# Patient Record
Sex: Female | Born: 1945 | Race: White | Hispanic: No | Smoking: Current every day smoker
Health system: Southern US, Community
[De-identification: ages and names within clinical notes are randomized; demographics above are authoritative.]

## PROBLEM LIST (undated history)

## (undated) DIAGNOSIS — D649 Anemia, unspecified: Secondary | ICD-10-CM

## (undated) DIAGNOSIS — I6529 Occlusion and stenosis of unspecified carotid artery: Secondary | ICD-10-CM

## (undated) DIAGNOSIS — I10 Essential (primary) hypertension: Secondary | ICD-10-CM

## (undated) DIAGNOSIS — E785 Hyperlipidemia, unspecified: Secondary | ICD-10-CM

## (undated) DIAGNOSIS — J449 Chronic obstructive pulmonary disease, unspecified: Secondary | ICD-10-CM

## (undated) DIAGNOSIS — I639 Cerebral infarction, unspecified: Secondary | ICD-10-CM

## (undated) HISTORY — PX: CATARACT EXTRACTION W/ INTRAOCULAR LENS  IMPLANT, BILATERAL: SHX1307

## (undated) HISTORY — PX: CARPAL TUNNEL RELEASE: SHX101

## (undated) HISTORY — DX: Occlusion and stenosis of unspecified carotid artery: I65.29

## (undated) HISTORY — PX: BACK SURGERY: SHX140

## (undated) HISTORY — PX: ABDOMINAL HYSTERECTOMY: SHX81

## (undated) HISTORY — DX: Anemia, unspecified: D64.9

## (undated) HISTORY — PX: JOINT REPLACEMENT: SHX530

## (undated) HISTORY — DX: Cerebral infarction, unspecified: I63.9

## (undated) HISTORY — PX: SHOULDER ARTHROSCOPY: SHX128

## (undated) HISTORY — PX: TUBAL LIGATION: SHX77

---

## 1983-03-23 HISTORY — PX: HEMORRHOID SURGERY: SHX153

## 2000-05-24 ENCOUNTER — Ambulatory Visit (HOSPITAL_COMMUNITY): Admission: RE | Admit: 2000-05-24 | Discharge: 2000-05-24 | Payer: Self-pay | Admitting: Neurology

## 2000-05-24 ENCOUNTER — Encounter: Payer: Self-pay | Admitting: Neurology

## 2000-05-30 ENCOUNTER — Encounter: Admission: RE | Admit: 2000-05-30 | Discharge: 2000-08-28 | Payer: Self-pay | Admitting: Anesthesiology

## 2001-06-22 ENCOUNTER — Encounter: Admission: RE | Admit: 2001-06-22 | Discharge: 2001-06-22 | Payer: Self-pay | Admitting: Internal Medicine

## 2001-06-22 ENCOUNTER — Encounter: Payer: Self-pay | Admitting: Internal Medicine

## 2001-08-23 ENCOUNTER — Encounter: Admission: RE | Admit: 2001-08-23 | Discharge: 2001-08-23 | Payer: Self-pay | Admitting: Internal Medicine

## 2001-08-23 ENCOUNTER — Encounter: Payer: Self-pay | Admitting: Internal Medicine

## 2001-11-14 ENCOUNTER — Encounter: Admission: RE | Admit: 2001-11-14 | Discharge: 2001-11-14 | Payer: Self-pay | Admitting: Internal Medicine

## 2001-11-14 ENCOUNTER — Encounter: Payer: Self-pay | Admitting: Internal Medicine

## 2003-02-13 ENCOUNTER — Emergency Department (HOSPITAL_COMMUNITY): Admission: EM | Admit: 2003-02-13 | Discharge: 2003-02-13 | Payer: Self-pay | Admitting: Emergency Medicine

## 2003-06-20 ENCOUNTER — Encounter: Admission: RE | Admit: 2003-06-20 | Discharge: 2003-06-20 | Payer: Self-pay | Admitting: Orthopaedic Surgery

## 2003-07-18 ENCOUNTER — Encounter: Admission: RE | Admit: 2003-07-18 | Discharge: 2003-07-18 | Payer: Self-pay | Admitting: Orthopaedic Surgery

## 2003-07-25 ENCOUNTER — Observation Stay (HOSPITAL_COMMUNITY): Admission: RE | Admit: 2003-07-25 | Discharge: 2003-07-26 | Payer: Self-pay | Admitting: Orthopaedic Surgery

## 2003-11-18 ENCOUNTER — Encounter: Admission: RE | Admit: 2003-11-18 | Discharge: 2003-11-18 | Payer: Self-pay | Admitting: Orthopaedic Surgery

## 2004-02-23 ENCOUNTER — Emergency Department (HOSPITAL_COMMUNITY): Admission: EM | Admit: 2004-02-23 | Discharge: 2004-02-23 | Payer: Self-pay | Admitting: Emergency Medicine

## 2004-04-29 ENCOUNTER — Ambulatory Visit (HOSPITAL_COMMUNITY): Admission: RE | Admit: 2004-04-29 | Discharge: 2004-04-29 | Payer: Self-pay | Admitting: Orthopaedic Surgery

## 2005-07-13 ENCOUNTER — Observation Stay (HOSPITAL_COMMUNITY): Admission: EM | Admit: 2005-07-13 | Discharge: 2005-07-15 | Payer: Self-pay | Admitting: Emergency Medicine

## 2005-07-14 ENCOUNTER — Encounter: Payer: Self-pay | Admitting: Cardiology

## 2005-08-06 ENCOUNTER — Encounter: Admission: RE | Admit: 2005-08-06 | Discharge: 2005-08-06 | Payer: Self-pay | Admitting: *Deleted

## 2005-09-08 ENCOUNTER — Ambulatory Visit (HOSPITAL_COMMUNITY): Admission: RE | Admit: 2005-09-08 | Discharge: 2005-09-09 | Payer: Self-pay | Admitting: Orthopaedic Surgery

## 2006-06-09 ENCOUNTER — Encounter: Admission: RE | Admit: 2006-06-09 | Discharge: 2006-06-09 | Payer: Self-pay | Admitting: Family Medicine

## 2006-08-31 ENCOUNTER — Encounter: Admission: RE | Admit: 2006-08-31 | Discharge: 2006-08-31 | Payer: Self-pay | Admitting: Orthopaedic Surgery

## 2006-10-26 ENCOUNTER — Inpatient Hospital Stay (HOSPITAL_COMMUNITY): Admission: RE | Admit: 2006-10-26 | Discharge: 2006-10-27 | Payer: Self-pay | Admitting: Orthopaedic Surgery

## 2007-06-22 ENCOUNTER — Encounter: Admission: RE | Admit: 2007-06-22 | Discharge: 2007-06-22 | Payer: Self-pay | Admitting: Family Medicine

## 2008-08-08 ENCOUNTER — Encounter: Admission: RE | Admit: 2008-08-08 | Discharge: 2008-08-08 | Payer: Self-pay | Admitting: Neurosurgery

## 2008-10-07 ENCOUNTER — Encounter: Admission: RE | Admit: 2008-10-07 | Discharge: 2008-10-07 | Payer: Self-pay | Admitting: Family Medicine

## 2008-10-16 ENCOUNTER — Ambulatory Visit (HOSPITAL_COMMUNITY): Admission: RE | Admit: 2008-10-16 | Discharge: 2008-10-16 | Payer: Self-pay | Admitting: Family Medicine

## 2008-10-21 ENCOUNTER — Encounter: Admission: RE | Admit: 2008-10-21 | Discharge: 2008-10-21 | Payer: Self-pay | Admitting: Family Medicine

## 2008-10-25 ENCOUNTER — Ambulatory Visit (HOSPITAL_COMMUNITY): Admission: RE | Admit: 2008-10-25 | Discharge: 2008-10-25 | Payer: Self-pay | Admitting: Family Medicine

## 2008-10-28 ENCOUNTER — Ambulatory Visit: Payer: Self-pay | Admitting: Hematology & Oncology

## 2009-03-24 ENCOUNTER — Encounter: Admission: RE | Admit: 2009-03-24 | Discharge: 2009-03-24 | Payer: Self-pay | Admitting: Family Medicine

## 2009-04-16 ENCOUNTER — Ambulatory Visit: Payer: Self-pay | Admitting: Hematology & Oncology

## 2009-04-22 LAB — CBC WITH DIFFERENTIAL (CANCER CENTER ONLY)
BASO%: 0.6 % (ref 0.0–2.0)
EOS%: 4 % (ref 0.0–7.0)
HCT: 36.3 % (ref 34.8–46.6)
LYMPH#: 1.6 10*3/uL (ref 0.9–3.3)
LYMPH%: 33.1 % (ref 14.0–48.0)
MCHC: 33.4 g/dL (ref 32.0–36.0)
MCV: 95 fL (ref 81–101)
NEUT%: 52.8 % (ref 39.6–80.0)
Platelets: 280 10*3/uL (ref 145–400)
RDW: 12.2 % (ref 10.5–14.6)

## 2009-04-22 LAB — COMPREHENSIVE METABOLIC PANEL
ALT: <8 U/L (ref 0–35)
AST: 18 U/L (ref 0–37)
Creatinine, Ser: 0.72 mg/dL (ref 0.40–1.20)
Total Bilirubin: 0.3 mg/dL (ref 0.3–1.2)

## 2009-05-29 ENCOUNTER — Ambulatory Visit: Payer: Self-pay | Admitting: Hematology & Oncology

## 2009-07-08 ENCOUNTER — Ambulatory Visit: Payer: Self-pay | Admitting: Hematology & Oncology

## 2009-07-11 LAB — COMPREHENSIVE METABOLIC PANEL
AST: 17 U/L (ref 0–37)
CO2: 28 mEq/L (ref 19–32)
Chloride: 101 mEq/L (ref 96–112)
Sodium: 141 mEq/L (ref 135–145)
Total Protein: 6.8 g/dL (ref 6.0–8.3)

## 2009-07-11 LAB — CBC WITH DIFFERENTIAL (CANCER CENTER ONLY)
BASO#: 0.1 10*3/uL (ref 0.0–0.2)
BASO%: 0.6 % (ref 0.0–2.0)
EOS%: 2.4 % (ref 0.0–7.0)
HCT: 35.8 % (ref 34.8–46.6)
MCH: 31.8 pg (ref 26.0–34.0)
MCV: 95 fL (ref 81–101)
NEUT#: 5.9 10*3/uL (ref 1.5–6.5)
RBC: 3.78 10*6/uL (ref 3.70–5.32)
WBC: 8.9 10*3/uL (ref 3.9–10.0)

## 2009-07-11 LAB — PREALBUMIN: Prealbumin: 21 mg/dL (ref 18.0–45.0)

## 2009-08-29 ENCOUNTER — Ambulatory Visit: Payer: Self-pay | Admitting: Hematology & Oncology

## 2009-09-01 LAB — COMPREHENSIVE METABOLIC PANEL
AST: 15 U/L (ref 0–37)
Albumin: 4 g/dL (ref 3.5–5.2)
Creatinine, Ser: 0.86 mg/dL (ref 0.40–1.20)
Potassium: 4.2 mEq/L (ref 3.5–5.3)
Sodium: 140 mEq/L (ref 135–145)
Total Bilirubin: 0.2 mg/dL — ABNORMAL LOW (ref 0.3–1.2)
Total Protein: 6.6 g/dL (ref 6.0–8.3)

## 2009-09-01 LAB — CBC WITH DIFFERENTIAL (CANCER CENTER ONLY)
Eosinophils Absolute: 0.2 10*3/uL (ref 0.0–0.5)
HCT: 32.1 % — ABNORMAL LOW (ref 34.8–46.6)
LYMPH#: 1.9 10*3/uL (ref 0.9–3.3)
MONO%: 8.5 % (ref 0.0–13.0)
WBC: 6.8 10*3/uL (ref 3.9–10.0)

## 2009-09-02 ENCOUNTER — Encounter: Admission: RE | Admit: 2009-09-02 | Discharge: 2009-09-02 | Payer: Self-pay | Admitting: Family Medicine

## 2009-10-08 ENCOUNTER — Encounter: Admission: RE | Admit: 2009-10-08 | Discharge: 2009-10-08 | Payer: Self-pay | Admitting: *Deleted

## 2009-11-25 ENCOUNTER — Ambulatory Visit: Payer: Self-pay | Admitting: Hematology & Oncology

## 2009-11-27 LAB — CBC WITH DIFFERENTIAL (CANCER CENTER ONLY)
BASO%: 0.7 % (ref 0.0–2.0)
Eosinophils Absolute: 0.2 10*3/uL (ref 0.0–0.5)
HCT: 36.9 % (ref 34.8–46.6)
HGB: 12.1 g/dL (ref 11.6–15.9)
MCHC: 32.9 g/dL (ref 32.0–36.0)
MCV: 94 fL (ref 81–101)
MONO%: 6.1 % (ref 0.0–13.0)
NEUT%: 74.1 % (ref 39.6–80.0)
RBC: 3.94 10*6/uL (ref 3.70–5.32)

## 2009-11-27 LAB — CHCC SATELLITE - SMEAR

## 2009-11-28 LAB — RETICULOCYTES (CHCC)
RBC.: 3.94 MIL/uL (ref 3.87–5.11)
Retic Ct Pct: 0.9 % (ref 0.4–3.1)

## 2009-11-28 LAB — COMPREHENSIVE METABOLIC PANEL
Alkaline Phosphatase: 42 U/L (ref 39–117)
BUN: 10 mg/dL (ref 6–23)
CO2: 26 mEq/L (ref 19–32)
Calcium: 10.2 mg/dL (ref 8.4–10.5)
Creatinine, Ser: 0.72 mg/dL (ref 0.40–1.20)
Potassium: 3.9 mEq/L (ref 3.5–5.3)
Total Bilirubin: 0.3 mg/dL (ref 0.3–1.2)

## 2009-11-28 LAB — IRON AND TIBC
Iron: 72 ug/dL (ref 42–145)
UIBC: 251 ug/dL

## 2009-11-28 LAB — PREALBUMIN: Prealbumin: 25.7 mg/dL (ref 18.0–45.0)

## 2009-11-28 LAB — FERRITIN: Ferritin: 68 ng/mL (ref 10–291)

## 2010-01-22 ENCOUNTER — Ambulatory Visit: Payer: Self-pay | Admitting: Hematology & Oncology

## 2010-01-27 LAB — CBC WITH DIFFERENTIAL (CANCER CENTER ONLY)
BASO%: 0.6 % (ref 0.0–2.0)
Eosinophils Absolute: 0.2 10*3/uL (ref 0.0–0.5)
HCT: 36.5 % (ref 34.8–46.6)
HGB: 12 g/dL (ref 11.6–15.9)
LYMPH#: 2 10*3/uL (ref 0.9–3.3)
LYMPH%: 23.6 % (ref 14.0–48.0)
MCH: 31.1 pg (ref 26.0–34.0)
MCHC: 32.7 g/dL (ref 32.0–36.0)
MCV: 95 fL (ref 81–101)
MONO#: 0.6 10*3/uL (ref 0.1–0.9)
NEUT#: 5.5 10*3/uL (ref 1.5–6.5)
Platelets: 318 10*3/uL (ref 145–400)
RDW: 13 % (ref 10.5–14.6)

## 2010-01-27 LAB — COMPREHENSIVE METABOLIC PANEL
ALT: <8 U/L (ref 0–35)
Albumin: 4.7 g/dL (ref 3.5–5.2)
Alkaline Phosphatase: 40 U/L (ref 39–117)
CO2: 28 mEq/L (ref 19–32)
Glucose, Bld: 102 mg/dL — ABNORMAL HIGH (ref 70–99)
Potassium: 3.8 mEq/L (ref 3.5–5.3)
Sodium: 140 mEq/L (ref 135–145)
Total Bilirubin: 0.3 mg/dL (ref 0.3–1.2)
Total Protein: 6.6 g/dL (ref 6.0–8.3)

## 2010-02-26 ENCOUNTER — Encounter
Admission: RE | Admit: 2010-02-26 | Discharge: 2010-02-26 | Payer: Self-pay | Source: Home / Self Care | Attending: Family Medicine | Admitting: Family Medicine

## 2010-03-05 ENCOUNTER — Ambulatory Visit (HOSPITAL_BASED_OUTPATIENT_CLINIC_OR_DEPARTMENT_OTHER)
Admission: RE | Admit: 2010-03-05 | Discharge: 2010-03-05 | Payer: Self-pay | Source: Home / Self Care | Attending: Hematology & Oncology | Admitting: Hematology & Oncology

## 2010-03-05 ENCOUNTER — Ambulatory Visit: Payer: Self-pay | Admitting: Hematology & Oncology

## 2010-03-18 ENCOUNTER — Ambulatory Visit (HOSPITAL_COMMUNITY)
Admission: RE | Admit: 2010-03-18 | Discharge: 2010-03-18 | Payer: Self-pay | Source: Home / Self Care | Attending: Hematology & Oncology | Admitting: Hematology & Oncology

## 2010-03-26 ENCOUNTER — Ambulatory Visit (HOSPITAL_COMMUNITY)
Admission: RE | Admit: 2010-03-26 | Discharge: 2010-03-26 | Payer: Self-pay | Source: Home / Self Care | Attending: Psychiatry | Admitting: Psychiatry

## 2010-03-26 LAB — PROTIME-INR
INR: 0.95 (ref 0.00–1.49)
Prothrombin Time: 12.9 seconds (ref 11.6–15.2)

## 2010-03-26 LAB — CBC
HCT: 40.2 % (ref 36.0–46.0)
Hemoglobin: 13 g/dL (ref 12.0–15.0)
MCH: 32.5 pg (ref 26.0–34.0)
MCHC: 32.3 g/dL (ref 30.0–36.0)
MCV: 100.5 fL — ABNORMAL HIGH (ref 78.0–100.0)
Platelets: 270 10*3/uL (ref 150–400)
RBC: 4 MIL/uL (ref 3.87–5.11)
RDW: 13.6 % (ref 11.5–15.5)
WBC: 7 10*3/uL (ref 4.0–10.5)

## 2010-03-26 LAB — APTT: aPTT: 29 seconds (ref 24–37)

## 2010-03-31 LAB — COMPREHENSIVE METABOLIC PANEL
ALT: <8 U/L (ref 0–35)
AST: 16 U/L (ref 0–37)
Albumin: 4.6 g/dL (ref 3.5–5.2)
Alkaline Phosphatase: 50 U/L (ref 39–117)
BUN: 16 mg/dL (ref 6–23)
CO2: 29 mEq/L (ref 19–32)
Calcium: 10.1 mg/dL (ref 8.4–10.5)
Chloride: 98 mEq/L (ref 96–112)
Creatinine, Ser: 0.82 mg/dL (ref 0.40–1.20)
Glucose, Bld: 132 mg/dL — ABNORMAL HIGH (ref 70–99)
Potassium: 3.9 mEq/L (ref 3.5–5.3)
Sodium: 137 mEq/L (ref 135–145)
Total Bilirubin: 0.2 mg/dL — ABNORMAL LOW (ref 0.3–1.2)
Total Protein: 7 g/dL (ref 6.0–8.3)

## 2010-03-31 LAB — PREALBUMIN: Prealbumin: 26.3 mg/dL (ref 17.0–34.0)

## 2010-03-31 LAB — CBC WITH DIFFERENTIAL (CANCER CENTER ONLY)
BASO#: 0 10*3/uL (ref 0.0–0.2)
BASO%: 0.5 % (ref 0.0–2.0)
EOS%: 1.9 % (ref 0.0–7.0)
Eosinophils Absolute: 0.2 10*3/uL (ref 0.0–0.5)
HCT: 39.3 % (ref 34.8–46.6)
HGB: 13.1 g/dL (ref 11.6–15.9)
LYMPH#: 1.9 10*3/uL (ref 0.9–3.3)
LYMPH%: 22.9 % (ref 14.0–48.0)
MCH: 32.5 pg (ref 26.0–34.0)
MCHC: 33.4 g/dL (ref 32.0–36.0)
MCV: 97 fL (ref 81–101)
MONO#: 0.5 10*3/uL (ref 0.1–0.9)
MONO%: 6.6 % (ref 0.0–13.0)
NEUT#: 5.6 10*3/uL (ref 1.5–6.5)
NEUT%: 68.1 % (ref 39.6–80.0)
Platelets: 313 10*3/uL (ref 145–400)
RBC: 4.04 10*6/uL (ref 3.70–5.32)
RDW: 11.7 % (ref 10.5–14.6)
WBC: 8.2 10*3/uL (ref 3.9–10.0)

## 2010-04-09 ENCOUNTER — Ambulatory Visit (HOSPITAL_BASED_OUTPATIENT_CLINIC_OR_DEPARTMENT_OTHER): Payer: MEDICARE | Admitting: Hematology & Oncology

## 2010-04-09 LAB — CBC WITH DIFFERENTIAL (CANCER CENTER ONLY)
BASO#: 0 10*3/uL (ref 0.0–0.2)
BASO%: 0.4 % (ref 0.0–2.0)
EOS%: 3.6 % (ref 0.0–7.0)
Eosinophils Absolute: 0.2 10*3/uL (ref 0.0–0.5)
HCT: 36.9 % (ref 34.8–46.6)
HGB: 12.4 g/dL (ref 11.6–15.9)
LYMPH#: 1.1 10*3/uL (ref 0.9–3.3)
LYMPH%: 25.3 % (ref 14.0–48.0)
MCH: 32.5 pg (ref 26.0–34.0)
MCHC: 33.7 g/dL (ref 32.0–36.0)
MCV: 97 fL (ref 81–101)
MONO#: 0.5 10*3/uL (ref 0.1–0.9)
MONO%: 10.9 % (ref 0.0–13.0)
NEUT#: 2.6 10*3/uL (ref 1.5–6.5)
NEUT%: 59.8 % (ref 39.6–80.0)
Platelets: 245 10*3/uL (ref 145–400)
RBC: 3.81 10*6/uL (ref 3.70–5.32)
RDW: 10.9 % (ref 10.5–14.6)
WBC: 4.3 10*3/uL (ref 3.9–10.0)

## 2010-04-09 LAB — TECHNOLOGIST REVIEW CHCC SATELLITE: Tech Review: 1

## 2010-04-12 ENCOUNTER — Encounter: Payer: Self-pay | Admitting: Hematology & Oncology

## 2010-04-13 ENCOUNTER — Encounter: Payer: Self-pay | Admitting: Family Medicine

## 2010-04-23 ENCOUNTER — Encounter (HOSPITAL_BASED_OUTPATIENT_CLINIC_OR_DEPARTMENT_OTHER): Payer: MEDICARE | Admitting: Hematology & Oncology

## 2010-04-23 DIAGNOSIS — J189 Pneumonia, unspecified organism: Secondary | ICD-10-CM

## 2010-04-23 DIAGNOSIS — C3490 Malignant neoplasm of unspecified part of unspecified bronchus or lung: Secondary | ICD-10-CM

## 2010-04-23 DIAGNOSIS — C3410 Malignant neoplasm of upper lobe, unspecified bronchus or lung: Secondary | ICD-10-CM

## 2010-04-23 DIAGNOSIS — J984 Other disorders of lung: Secondary | ICD-10-CM

## 2010-04-23 DIAGNOSIS — J449 Chronic obstructive pulmonary disease, unspecified: Secondary | ICD-10-CM

## 2010-04-23 LAB — COMPREHENSIVE METABOLIC PANEL
AST: 20 U/L (ref 0–37)
Albumin: 4.2 g/dL (ref 3.5–5.2)
BUN: 14 mg/dL (ref 6–23)
CO2: 28 mEq/L (ref 19–32)
Chloride: 102 mEq/L (ref 96–112)
Glucose, Bld: 104 mg/dL — ABNORMAL HIGH (ref 70–99)
Potassium: 5.2 mEq/L (ref 3.5–5.3)
Total Bilirubin: 0.2 mg/dL — ABNORMAL LOW (ref 0.3–1.2)
Total Protein: 6.7 g/dL (ref 6.0–8.3)

## 2010-04-23 LAB — CBC WITH DIFFERENTIAL (CANCER CENTER ONLY)
BASO#: 0.1 10*3/uL (ref 0.0–0.2)
BASO%: 0.7 % (ref 0.0–2.0)
HCT: 34.5 % — ABNORMAL LOW (ref 34.8–46.6)
LYMPH%: 17.9 % (ref 14.0–48.0)
MCV: 98 fL (ref 81–101)
MONO%: 11.5 % (ref 0.0–13.0)
WBC: 9.5 10*3/uL (ref 3.9–10.0)

## 2010-04-29 ENCOUNTER — Other Ambulatory Visit: Payer: Self-pay | Admitting: Family

## 2010-04-29 ENCOUNTER — Encounter (HOSPITAL_BASED_OUTPATIENT_CLINIC_OR_DEPARTMENT_OTHER): Payer: MEDICARE | Admitting: Hematology & Oncology

## 2010-04-29 DIAGNOSIS — J3489 Other specified disorders of nose and nasal sinuses: Secondary | ICD-10-CM

## 2010-04-29 DIAGNOSIS — C50919 Malignant neoplasm of unspecified site of unspecified female breast: Secondary | ICD-10-CM

## 2010-04-29 DIAGNOSIS — C3410 Malignant neoplasm of upper lobe, unspecified bronchus or lung: Secondary | ICD-10-CM

## 2010-04-29 DIAGNOSIS — C779 Secondary and unspecified malignant neoplasm of lymph node, unspecified: Secondary | ICD-10-CM

## 2010-04-29 DIAGNOSIS — B9789 Other viral agents as the cause of diseases classified elsewhere: Secondary | ICD-10-CM

## 2010-04-29 LAB — CBC WITH DIFFERENTIAL (CANCER CENTER ONLY)
BASO#: 0.1 10*3/uL (ref 0.0–0.2)
HCT: 35.1 % (ref 34.8–46.6)
MCH: 32.4 pg (ref 26.0–34.0)
MCHC: 33 g/dL (ref 32.0–36.0)
MCV: 98 fL (ref 81–101)
MONO%: 8 % (ref 0.0–13.0)
NEUT%: 71.4 % (ref 39.6–80.0)
Platelets: 333 10*3/uL (ref 145–400)
RDW: 10.8 % (ref 10.5–14.6)
WBC: 9.4 10*3/uL (ref 3.9–10.0)

## 2010-05-22 ENCOUNTER — Encounter (HOSPITAL_BASED_OUTPATIENT_CLINIC_OR_DEPARTMENT_OTHER): Payer: MEDICARE | Admitting: Hematology & Oncology

## 2010-05-22 ENCOUNTER — Other Ambulatory Visit: Payer: Self-pay | Admitting: Hematology & Oncology

## 2010-05-22 DIAGNOSIS — B9789 Other viral agents as the cause of diseases classified elsewhere: Secondary | ICD-10-CM

## 2010-05-22 DIAGNOSIS — C3410 Malignant neoplasm of upper lobe, unspecified bronchus or lung: Secondary | ICD-10-CM

## 2010-05-22 DIAGNOSIS — J3489 Other specified disorders of nose and nasal sinuses: Secondary | ICD-10-CM

## 2010-05-22 LAB — CBC WITH DIFFERENTIAL (CANCER CENTER ONLY)
BASO%: 0.5 % (ref 0.0–2.0)
EOS%: 2.8 % (ref 0.0–7.0)
HCT: 34.2 % — ABNORMAL LOW (ref 34.8–46.6)
LYMPH%: 18.8 % (ref 14.0–48.0)
MCHC: 31.9 g/dL — ABNORMAL LOW (ref 32.0–36.0)
MCV: 103 fL — ABNORMAL HIGH (ref 81–101)
NEUT%: 70 % (ref 39.6–80.0)
Platelets: 257 10*3/uL (ref 145–400)
RDW: 13.4 % (ref 11.1–15.7)
WBC: 8.1 10*3/uL (ref 3.9–10.0)

## 2010-06-01 LAB — GLUCOSE, CAPILLARY: Glucose-Capillary: 96 mg/dL (ref 70–99)

## 2010-06-28 LAB — GLUCOSE, CAPILLARY: Glucose-Capillary: 110 mg/dL — ABNORMAL HIGH (ref 70–99)

## 2010-07-30 ENCOUNTER — Other Ambulatory Visit: Payer: Self-pay | Admitting: Family

## 2010-07-30 ENCOUNTER — Encounter (HOSPITAL_BASED_OUTPATIENT_CLINIC_OR_DEPARTMENT_OTHER): Payer: MEDICARE | Admitting: Hematology & Oncology

## 2010-07-30 DIAGNOSIS — J3489 Other specified disorders of nose and nasal sinuses: Secondary | ICD-10-CM

## 2010-07-30 DIAGNOSIS — B9789 Other viral agents as the cause of diseases classified elsewhere: Secondary | ICD-10-CM

## 2010-07-30 DIAGNOSIS — C3410 Malignant neoplasm of upper lobe, unspecified bronchus or lung: Secondary | ICD-10-CM

## 2010-07-30 DIAGNOSIS — C50919 Malignant neoplasm of unspecified site of unspecified female breast: Secondary | ICD-10-CM

## 2010-07-30 LAB — CBC WITH DIFFERENTIAL (CANCER CENTER ONLY)
BASO#: 0 10*3/uL (ref 0.0–0.2)
BASO%: 0.4 % (ref 0.0–2.0)
EOS%: 2.8 % (ref 0.0–7.0)
HCT: 38.6 % (ref 34.8–46.6)
HGB: 12.4 g/dL (ref 11.6–15.9)
LYMPH#: 1.7 10*3/uL (ref 0.9–3.3)
LYMPH%: 18.8 % (ref 14.0–48.0)
MCHC: 32.1 g/dL (ref 32.0–36.0)
MCV: 101 fL (ref 81–101)
MONO#: 0.8 10*3/uL (ref 0.1–0.9)
NEUT%: 68.9 % (ref 39.6–80.0)
RDW: 13.2 % (ref 11.1–15.7)

## 2010-07-30 LAB — COMPREHENSIVE METABOLIC PANEL
ALT: <8 U/L (ref 0–35)
BUN: 11 mg/dL (ref 6–23)
CO2: 23 mEq/L (ref 19–32)
Calcium: 9.5 mg/dL (ref 8.4–10.5)
Chloride: 100 mEq/L (ref 96–112)
Creatinine, Ser: 0.78 mg/dL (ref 0.40–1.20)
Total Bilirubin: 0.3 mg/dL (ref 0.3–1.2)

## 2010-08-04 NOTE — Op Note (Signed)
Anne Morrow, MARCEAUX NO.:  [id number]   MEDICAL RECORD NO.:  [id number]          PATIENT TYPE:  INP   LOCATION:  2550                         FACILITY:  [facility]   PHYSICIAN:  Sharolyn Douglas, M.D.        DATE OF BIRTH:  March 31, 1945   DATE OF PROCEDURE:  10/26/2006  DATE OF DISCHARGE:                               OPERATIVE REPORT   DIAGNOSIS:  1. 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  2. Severe persistent neck and bilateral upper extremity pain, left      greater than right.   PROCEDURE:  1. Exploration of anterior cervical fusion C5-6.  2. Anterior cervical diskectomy C6-7 with decompression of the spinal      cord and nerve roots bilaterally.  3. Anterior cervical fusion C6-7 with placement of 7-mm PEEK cage      packed with local autogenous bone graft.  4. Anterior cervical plating C6 7 using Abbott spine system.   SURGEON:  Sharolyn Douglas, MD.   ASSISTANT:  Aura Fey. Bobbe Medico.   ANESTHESIA:  General endotracheal.   ESTIMATED BLOOD LOSS:  Minimal.   COMPLICATIONS:  None.   NEEDLE AND SPONGE COUNT:  Correct.   INDICATIONS:  The patient is a pleasant 65 year old female who has had a  previous two level anterior cervical fusion from C4-C6.  Recently she  had the super adjacent disk fused at C3-4 with good results.  She has  now developed subjacent degenerative changes and foraminal stenosis at  C6-7 below and now presents to have this level decompressed and fused as  well.  Risks, benefits and alternatives reviewed.  The patient elected  to proceed.   PROCEDURE:  After informed consent, she is taken the operating room.  She underwent general endotracheal anesthesia without difficulty, given  prophylactic IV antibiotics.  Carefully positioned supine on the  operating room table with [place]field headrest.  Her neck was placed in  slight extension.  Five pounds halter traction applied.  The neck was  prepped and draped usual sterile fashion.  A small transverse incision  was made.  Left side of the neck at the level of the cricoid cartilage  below her previous incision.  Dissection was carried through the  previous scar tissue through the platysma.  The interval between the SCM  and strap muscles medially was developed down to the prevertebral space.  There was a large osteophyte noted at C6-7 and an intraoperative x-ray  taken to confirm this level.  We then exposed the C6-7 disk space using  the electrocautery.  The esophagus, trachea and carotid sheath were  protected at all times.  Leksell rongeur was used to remove the large  anterior osteophyte.  The disk itself was extremely degenerative.  We  placed distraction pins in the C6 and C7 vertebral bodies and the disk  space was mobile.  The microscope was draped and brought into the field.  A radical diskectomy was completed back to the posterior longitudinal  ligament.  The micro Kerrison punches were used  to take down the  posterior longitudinal ligament and complete wide foraminotomies.  We  found a foraminal disk herniation on the left side which was  decompressed from the neuro foramen using a nerve hook.  Hemostasis was  achieved.  We prepared the cartilaginous endplates for the fusion.  The  bone from the high-speed bur was saved and packed into a 7-mm PEEK cage.  The PEEK cage was inserted into the interspace countersunk 1 mm.  We  then placed a 24-mm anterior cervical plate from Z6-X0.  Before placing  the plate, we evaluated the fusion above and this was found to be solid.  The fusion was evaluated using the electrocautery.  A high-speed bur was  used to remove more of the anterior osteophyte to allow the plate to  seat down.  The plate was applied with four 12 mm screws.  The bone  quality was good, screw purchase was excellent.  We ensured that the  locking mechanism engaged.  We took an intraoperative x-ray which  showed  acceptable positioning of the instrumentation at C6-7.  Deep TLS drain  was left.  Meticulous hemostasis was achieved.  The deep platysma layer  was closed with interrupted 2-0 Vicryl suture.  Subcutaneous layer  closed with 3-0 Vicryl followed by Dermabond on skin edges.  She was  placed into a cervical collar, extubated without difficulty and  transferred to recovery in stable condition.   Should be noted my assistant Orlin Hilding, PA was present throughout  procedure.  She assisted with the exposure, she worked in [employer]  with me during the diskectomy and helped with the arthrodesis and also  with the instrumentation.  She assisted me with wound closure.      Sharolyn Douglas, M.D.  Electronically Signed     MC/MEDQ  D:  10/26/2006  T:  10/27/2006  Job:  [id number]

## 2010-08-07 NOTE — H&P (Signed)
Anne Morrow, Anne Morrow NO.:  [id number]   MEDICAL RECORD NO.:  [id number]          PATIENT TYPE:  AMB   LOCATION:  SDS                          FACILITY:  [facility]   PHYSICIAN:  Sharolyn Douglas, M.D.        DATE OF BIRTH:  July 16, 1945   DATE OF ADMISSION:  09/08/2005  DATE OF DISCHARGE:                                HISTORY & PHYSICAL   CHIEF COMPLAINT:  Neck pain.   HISTORY OF PRESENT ILLNESS:  [name] who is  having severe neck pain and pain that extends into her right shoulder with  radiation into her arm.  She has failed conservative treatment.  She reports  the pain is 10 out 10 on a visual analog scale.  She feels that it is  getting progressively worse.  She is quite miserable and desperate for some  relief of her pain.  Her activities of daily living and quality of life has  suffered significantly secondary to the pain.  She is on escalating doses of  narcotics to try to control the pain.  Previously Dr. Noel Gerold did discuss the  risks and benefits of the procedure with the patient.  I discussed them  again with her today and she indicated understanding and opted to proceed  with surgery.   ALLERGIES:  MORPHINE, ALLEGRA, CLARITIN.   MEDICATIONS:  1.  Vicodin.  2.  Oxygen.  3.  Plavix.  4.  Lipitor.  5.  Zyrtec.  6.  Altace.  7.  Aspirin.  8.  Fosamax.  9.  Nasonex.  10. Albuterol inhaler.   PAST MEDICAL HISTORY:  Stroke x3 with no lasting sequelae, asthma, COPD, and  hypertension.   PAST SURGICAL HISTORY:  ACDF x2.  Posterior cervical surgery.  Rib ORIF and  hardware removal as well.  Hysterectomy.  Hemorrhoidectomy.  Left shoulder  scope.   SOCIAL HISTORY:  The patient is married and smokes 1/2 pack of cigarettes  per day and denies alcohol use.   FAMILY HISTORY:  Noncontributory.   REVIEW OF SYSTEMS:  The patient denies fevers, chills, sweats, or bleeding  tendencies.  CNS:  Denies blurry vision, double vision,  seizures, headache,  or paralysis.  CARDIOVASCULAR:  Denies chest pain, angina, orthopnea,  claudication, or palpitations.  PULMONARY:  She does have occasional  shortness of breath.  She is treated with oxygen.  This is secondary to  COPD.  Denies productive cough or hemoptysis.  GASTROINTESTINAL:  Denies  nausea, vomiting, constipation, diarrhea, melena, or bloody stool.  GENITOURINARY:  Denies dysuria, hematuria, or discharge.  MUSCULOSKELETAL:  As per HPI.   PHYSICAL EXAMINATION:  VITAL SIGNS:  Pulse 76 and regular, respirations 24  and unlabored, blood pressure 120/70.  GENERAL:  The patient is a 65 year old white female who is alert and  oriented in no acute distress.  She is well-nourished and well-groomed,  appears stated age and pleasant and cooperative to examination.  HEENT:  Head is normocephalic and atraumatic.  Pupils equal, round, and  reactive to light.  Extraocular movements are intact.  Nares are  patent.  Pharynx is clear.  NECK:  Soft to palpation.  No lymphadenopathy or thyromegaly appreciated.  No bruits appreciated.  CHEST:  There is minimal wheezes throughout bilateral lung fields.  No  rales, rhonchi, stridor, or friction rubs.  BREASTS:  Not pertinent and not performed.  HEART:  S1 and S2, regular rate and rhythm, no murmurs, rubs, or gallops  noted.  ABDOMEN:  Soft to palpation, nontender, and nondistended.  No organomegaly  noted.  Positive bowel sounds throughout.  GENITOURINARY:  Not pertinent and not performed.  EXTREMITIES:  As per HPI.  SKIN:  Intact.  There are not any lesions or rashes.   IMPRESSION:  1.  Adjacent segment disease, C3-4.  2.  History of stroke x3 with no lasting sequelae.  3.  Asthma and chronic obstructive pulmonary disease.  4.  Hypertension.   PLAN:  Admit to [facility] on September 08, 2005, for revision ACDF C3-  4, removal of plate of C4 to C6.  Dr. Sharolyn Douglas will be the surgeon.   PRIMARY CARE PHYSICIAN:  Sharlet Salina, M.D. who has given her  preoperative medical clearance.   The patient's postoperative prescriptions were given to her today.  This is  Vicodin #90 and Robaxin #90 to use for pain and muscle spasm respectively.      Verlin Fester, P.A.      Sharolyn Douglas, M.D.  Electronically Signed    CM/MEDQ  D:  09/06/2005  T:  09/06/2005  Job:  [id number]

## 2010-08-07 NOTE — Procedures (Signed)
[facility]  Patient:    Anne Morrow, Anne Morrow                      MRN: [id number] Proc. Date: 05/31/00 Adm. Date:  [id number] Attending:  Thyra Breed CC:         Gustavus Messing. Orlin Hilding, M.D.  Sanjeev K. Corliss Skains, M.D.   Procedure Report  PROCEDURE:  Lumbar epidural blood patch.  DIAGNOSIS:  Headaches, suspected combination of cervicogenic plus posterior puncture headaches.  ANESTHESIOLOGIST:  Thyra Breed, M.D.  HISTORY: Anne Morrow is a 65 year old patient of Dr. Orlin Hilding who underwent a cervical myelogram by Dr. Corliss Skains on May 24, 2000, and noted the onset of a headache very shortly after the procedure.  She was noted to have cervical spondylosis with a fusion on the myelogram.  She has tried to rehydrate herself with coffee and Dr. Reino Kent but was not drinking more than a liter per day of Dr. Reino Kent.  Her headaches are made worse by sitting upright and improved by lying on her side or back.  PHYSICAL EXAMINATION:  VITAL SIGNS:  The patient is afebrile with vital signs stable.  Please see flow sheet.  NEUROLOGIC: Cranial nerves II-XII grossly intact.  Deep tendon reflexes were symmetric.  She was able to walk without difficulties.  DESCRIPTION OF PROCEDURE:  After informed consent was obtained, the patient was placed in the sitting position and monitored.  Her left antecubital fossa and back was prepped with Betadine x 3.  I anesthetized the back at the L3-4 interspace with 1% lidocaine.  A 20-gauge Tuohy needle was introduced in the lumbar epidural space to loss of resistance to preservative free normal saline.  There was no CSF nor blood.  Blood 15 ml was obtained from the left antecubital fossa under sterile conditions and injected into the epidural space.  The needle was flushed with 0.5 cc preservative free normal saline and removed intact.  POSTPROCEDURE CONDITION:  Stable.  DISCHARGE INSTRUCTIONS: 1. The patient was advised that she  needs to get 2 liter bottles of Dr. Reino Kent    or her favorite caffeinated beverage and drink at least two 2-liter bottles    per day or three if tolerated over the next three to four days. 2. Limitation of activities per instruction sheet as outlined by my    assistant today. 3. Continue on current medications per Dr. Orlin Hilding. 4. The patient is to follow up with Dr. Orlin Hilding. DD:  05/31/00 TD:  05/31/00 Job: [id number]

## 2010-08-07 NOTE — Discharge Summary (Signed)
Anne Morrow, PISANI NO.:  [id number]   MEDICAL RECORD NO.:  [id number]          PATIENT TYPE:  INP   LOCATION:  6703                         FACILITY:  [facility]   PHYSICIAN:  Jackie Plum, M.D.DATE OF BIRTH:  1945-05-19   DATE OF ADMISSION:  07/13/2005  DATE OF DISCHARGE:  07/15/2005                                 DISCHARGE SUMMARY   DISCHARGE DIAGNOSES:  1.  Chest pain, resolved.  __________  Adenosine Cardiolite was negative for      any reversible ischemic changes.  2.  History of chronic obstructive pulmonary disease.  3.  Cerebrovascular accident.  4.  Dyslipidemia.  5.  Cigarette smoking.   The patient is a 64 year old Caucasian lady with history of cervical neck  disease and spinal surgery.  She presented with chest pain with diaphoresis,  nausea and dizziness.  In the emergency room the patient was evaluated and  EKG was negative for acute ST wave changes.  Point of care cardiac markers  were negative for myocardial infarction.  X-ray did not show any acute  infiltrates.  She was admitted for further treatment and management of her  chest pain.   On admission the patient was put on a telemetry bed.  There were no  significant dysrhythmias.  Dr. Roger Shelter was consulted for cardiology.  His impression was atypical chest pain for angina and recommended adenosine  Cardiolite test, which came back negative.  The patient had been chest pain-  free in the hospital and was discharged on July 15, 2005, in stable and  satisfactory condition.  Please see full details regarding patient's  presentation by reviewing H&P dictated by me on July 13, 2005.  The patient  was discharged in stable, satisfactory condition.   CONSULTANT:  Colleen Can. Deborah Chalk, M.D.   PROCEDURES:  Adenosine Cardiolite.   CONDITION ON DISCHARGE:  Satisfactory.   PROCEDURES:  Adenosine Cardiolite testing.   CONDITION ON DISCHARGE:  Stable.      Jackie Plum, M.D.  Electronically Signed     GO/MEDQ  D:  08/26/2005  T:  08/27/2005  Job:  [id number]   cc:   Colleen Can. Deborah Chalk, M.D.  Fax: [phone]

## 2010-10-30 ENCOUNTER — Encounter (HOSPITAL_BASED_OUTPATIENT_CLINIC_OR_DEPARTMENT_OTHER): Payer: Medicare Other | Admitting: Hematology & Oncology

## 2010-10-30 ENCOUNTER — Other Ambulatory Visit: Payer: Self-pay | Admitting: Hematology & Oncology

## 2010-10-30 DIAGNOSIS — C3410 Malignant neoplasm of upper lobe, unspecified bronchus or lung: Secondary | ICD-10-CM

## 2010-10-30 LAB — COMPREHENSIVE METABOLIC PANEL
ALT: <8 U/L (ref 0–35)
AST: 19 U/L (ref 0–37)
Albumin: 4.5 g/dL (ref 3.5–5.2)
Alkaline Phosphatase: 43 U/L (ref 39–117)
Potassium: 4.5 mEq/L (ref 3.5–5.3)
Sodium: 140 mEq/L (ref 135–145)
Total Bilirubin: 0.3 mg/dL (ref 0.3–1.2)
Total Protein: 6.5 g/dL (ref 6.0–8.3)

## 2010-10-30 LAB — CBC WITH DIFFERENTIAL (CANCER CENTER ONLY)
BASO#: 0 10*3/uL (ref 0.0–0.2)
Eosinophils Absolute: 0.3 10*3/uL (ref 0.0–0.5)
HCT: 37.4 % (ref 34.8–46.6)
HGB: 12.3 g/dL (ref 11.6–15.9)
LYMPH#: 1.9 10*3/uL (ref 0.9–3.3)
LYMPH%: 26.7 % (ref 14.0–48.0)
MCV: 101 fL (ref 81–101)
MONO#: 0.7 10*3/uL (ref 0.1–0.9)
NEUT%: 59.8 % (ref 39.6–80.0)
WBC: 7.2 10*3/uL (ref 3.9–10.0)

## 2010-10-30 LAB — PREALBUMIN: Prealbumin: 26.1 mg/dL (ref 17.0–34.0)

## 2011-01-04 LAB — CBC
HCT: 43.7
Hemoglobin: 14.5
MCV: 97.3
Platelets: 275
RDW: 14.1 — ABNORMAL HIGH

## 2011-01-04 LAB — URINALYSIS, ROUTINE W REFLEX MICROSCOPIC
Bilirubin Urine: NEGATIVE
Hgb urine dipstick: NEGATIVE
Nitrite: NEGATIVE
Specific Gravity, Urine: 1.011
pH: 5.5

## 2011-01-04 LAB — COMPREHENSIVE METABOLIC PANEL
Albumin: 4.5
Alkaline Phosphatase: 55
BUN: 6
Creatinine, Ser: 0.89
Glucose, Bld: 133 — ABNORMAL HIGH
Potassium: 4.4
Total Bilirubin: 0.6
Total Protein: 7

## 2011-01-04 LAB — DIFFERENTIAL
Basophils Absolute: 0.1
Basophils Relative: 1
Lymphocytes Relative: 47 — ABNORMAL HIGH
Monocytes Relative: 6
Neutro Abs: 3.7
Neutrophils Relative %: 43

## 2011-01-04 LAB — URINE CULTURE

## 2011-01-04 LAB — APTT: aPTT: 29

## 2011-01-12 ENCOUNTER — Encounter: Payer: Self-pay | Admitting: *Deleted

## 2011-02-02 ENCOUNTER — Telehealth: Payer: Self-pay | Admitting: *Deleted

## 2011-02-05 ENCOUNTER — Ambulatory Visit (HOSPITAL_BASED_OUTPATIENT_CLINIC_OR_DEPARTMENT_OTHER): Payer: Medicare Other | Admitting: Family

## 2011-02-05 ENCOUNTER — Other Ambulatory Visit: Payer: Self-pay | Admitting: Hematology & Oncology

## 2011-02-05 ENCOUNTER — Other Ambulatory Visit (HOSPITAL_BASED_OUTPATIENT_CLINIC_OR_DEPARTMENT_OTHER): Payer: Medicare Other | Admitting: Lab

## 2011-02-05 VITALS — BP 97/59 | HR 77 | Temp 97.6°F | Ht 61.5 in | Wt 116.0 lb

## 2011-02-05 DIAGNOSIS — J069 Acute upper respiratory infection, unspecified: Secondary | ICD-10-CM

## 2011-02-05 DIAGNOSIS — J449 Chronic obstructive pulmonary disease, unspecified: Secondary | ICD-10-CM

## 2011-02-05 DIAGNOSIS — C3410 Malignant neoplasm of upper lobe, unspecified bronchus or lung: Secondary | ICD-10-CM

## 2011-02-05 DIAGNOSIS — R918 Other nonspecific abnormal finding of lung field: Secondary | ICD-10-CM | POA: Insufficient documentation

## 2011-02-05 DIAGNOSIS — Z9981 Dependence on supplemental oxygen: Secondary | ICD-10-CM

## 2011-02-05 LAB — CBC WITH DIFFERENTIAL (CANCER CENTER ONLY)
BASO#: 0.1 10*3/uL (ref 0.0–0.2)
BASO%: 0.6 % (ref 0.0–2.0)
Eosinophils Absolute: 0.2 10*3/uL (ref 0.0–0.5)
HCT: 37.9 % (ref 34.8–46.6)
LYMPH%: 30.8 % (ref 14.0–48.0)
MCV: 103 fL — ABNORMAL HIGH (ref 81–101)
MONO#: 0.7 10*3/uL (ref 0.1–0.9)
NEUT%: 58.1 % (ref 39.6–80.0)
RDW: 12.7 % (ref 11.1–15.7)
WBC: 7.9 10*3/uL (ref 3.9–10.0)

## 2011-02-05 LAB — PREALBUMIN: Prealbumin: 31.3 mg/dL (ref 17.0–34.0)

## 2011-02-05 LAB — COMPREHENSIVE METABOLIC PANEL
BUN: 12 mg/dL (ref 6–23)
CO2: 26 mEq/L (ref 19–32)
Creatinine, Ser: 0.9 mg/dL (ref 0.50–1.10)
Glucose, Bld: 95 mg/dL (ref 70–99)
Total Bilirubin: 0.2 mg/dL — ABNORMAL LOW (ref 0.3–1.2)

## 2011-02-05 MED ORDER — AZITHROMYCIN 250 MG PO TABS: 250.0000 mg | ORAL_TABLET | Freq: Every day | ORAL | Status: DC

## 2011-02-05 NOTE — Progress Notes (Signed)
DIAGNOSIS:1.  Presumptive adenocarcinoma of the lung. (Lung nodules positive on PET scan). 2. COPD    Encounter Diagnosis  . Upper respiratory infection     CURRENT THERAPY: No active treatment, and supportive care as needed.  INTERIM HISTORY: Patient comes in for scheduled followup, and we are seeing her every 3 months. No recurrent pneumonia since we saw her last, although she does have upper respiratory infection at present. Last PET scan was December, and 2011, and showed 3 dominant lung lesions, all positive on PET. Previous biopsy had shown granuloma. Occasional cough, on continuous oxygen therapy. Nasal congestion and cough began several days ago. No bleeding. No abdominal pain. No headache or blurred vision. No upper or lower extremity edema.  All things considered, is happy to report she feels quite well and is looking forward to the holidays.  Has received flu shot from primary care.   PHYSICAL EXAM: BP 97/59  Pulse 77  Temp(Src) 97.6 F (36.4 C) (Oral)  Ht 5' 1.5" (1.562 m)  Wt 116 lb (52.617 kg)  BMI 21.56 kg/m2 General: Well developed, well nourished, in no acute distress.  EENT: No ocular or oral lesions. No stomatitis. Nasal congestion Respiratory: Lungs are diminished bilaterally with labored respiratory movement and accessory muscle use. Cardiac: No murmur, rub or tachycardia. No upper or lower extremity edema.  GI: Abdomen is soft, no palpable hepatosplenomegaly. No fluid wave. No tenderness. Musculoskeletal: Kyphosis, no tenderness over the spine, ribs or hips. Lymph: No cervical, infraclavicular, axillary or inguinal adenopathy. Neuro: No focal neurological deficits. Psych: Alert and oriented X 3, appropriate mood and affect.   LABORATORY STUDIES:   Results for orders placed in visit on 02/05/11  CBC WITH DIFFERENTIAL ([facility])      Component Value Range   WBC 7.9  3.9 - 10.0 (10e3/uL)   RBC 3.68 (*) 3.70 - 5.32 (10e6/uL)   HGB 12.5  11.6 - 15.9  (g/dL)   HCT 16.1  09.6 - 04.5 (%)   MCV 103 (*) 81 - 101 (fL)   MCH 34.0  26.0 - 34.0 (pg)   MCHC 33.0  32.0 - 36.0 (g/dL)   RDW 40.9  81.1 - 91.4 (%)   Platelets 317  145 - 400 (10e3/uL)   NEUT# 4.6  1.5 - 6.5 (10e3/uL)   LYMPH# 2.4  0.9 - 3.3 (10e3/uL)   MONO# 0.7  0.1 - 0.9 (10e3/uL)   Eosinophils Absolute 0.2  0.0 - 0.5 (10e3/uL)   BASO# 0.1  0.0 - 0.2 (10e3/uL)   NEUT% 58.1  39.6 - 80.0 (%)   LYMPH% 30.8  14.0 - 48.0 (%)   MONO% 8.5  0.0 - 13.0 (%)   EOS% 2.0  0.0 - 7.0 (%)   BASO% 0.6  0.0 - 2.0 (%)    IMPRESSION:  65 year old white female with: #1. Presumptive non-small cell lung cancer with lung nodules positive on PET scan, no definitive biopsy. Biopsy showed non-caseating granulomas. #2. New onset upper respiratory infection with cough and nasal congestion. #3. History of recurrent pneumonia.  PLAN: #1. Prescription for Z-Pak, take as directed with no refills. #2. Supportive care as indicated, and no active treatment planned. #3. Return to clinic to see Dr. Myna Hidalgo in 3 months. Since it has been a 1 year since she received chemotherapy, no lab will be ordered for that visit.

## 2011-04-16 NOTE — Telephone Encounter (Signed)
error 

## 2011-04-28 ENCOUNTER — Other Ambulatory Visit: Payer: Self-pay | Admitting: Family Medicine

## 2011-05-04 ENCOUNTER — Other Ambulatory Visit: Payer: Self-pay | Admitting: Family Medicine

## 2011-05-07 ENCOUNTER — Ambulatory Visit (HOSPITAL_BASED_OUTPATIENT_CLINIC_OR_DEPARTMENT_OTHER): Payer: Medicare Other | Admitting: Hematology & Oncology

## 2011-05-07 VITALS — BP 103/50 | HR 80 | Temp 97.5°F | Ht 61.5 in | Wt 121.0 lb

## 2011-05-07 DIAGNOSIS — J984 Other disorders of lung: Secondary | ICD-10-CM

## 2011-05-07 DIAGNOSIS — C3410 Malignant neoplasm of upper lobe, unspecified bronchus or lung: Secondary | ICD-10-CM

## 2011-05-07 NOTE — Progress Notes (Signed)
This office note has been dictated.

## 2011-05-08 NOTE — Progress Notes (Signed)
DIAGNOSES: 1. Presumptive adenocarcinoma of the lung (positive lung nodules on     PET scan). 2. Oxygen-dependent chronic obstructive pulmonary disease.  CURRENT THERAPY:  Observation.  INTERIM HISTORY:  Anne Morrow comes in for follow-up.  She is getting better every time we see her.  We last saw her back in November.  She is still on oxygen.  However, she is doing more activities.  She is eating better.  She is not hurting.  She is not having any kind of cough or hemoptysis.  I suppose that it might be difficult to say that she even had any kind of lung cancer.  She did have the CT-guided biopsy about a year ago in which the pathology report showed necrosis and some noncaseating granulomas.  The pathologist could not discount malignancy within the necrosis.  Based on that and also based on her clinical history and heavy tobacco use, it made clinical sense to give her chemotherapy.  We gave her one dose of treatment as she really had a tough time with it. As such, we have foregone any further therapy on her.  She has done well.  I suppose that we are probably looking at granulomatous disease and maybe not even malignancy.  She is enjoying herself.  She is eating well.  She is not having any problems with bowels or bladder.  She is having no leg swelling.  She is having no arthritic issues.  PHYSICAL EXAMINATION:  General Appearance:  This is a petite, elderly appearing white female in no obvious distress.  Vital Signs:  97.5, pulse 88, respiratory rate 22, blood pressure 103/50.  Weight is 121. Head and Neck Exam:  Shows a normocephalic, atraumatic skull.  There are no ocular or oral lesions.  There are no palpable cervical or supraclavicular lymph nodes.  Lungs:  With decreased breath sounds bilaterally.  She has some scattered crackles bilaterally.  She has no wheezes.  Cardiac Exam:  Regular rate and rhythm with a normal S1 and S2.  There are no murmurs, rubs or bruits.   Abdominal Exam:  Soft with good bowel sounds.  There is no palpable abdominal mass.  There is no palpable hepatosplenomegaly.  Extremities:  Show some osteoarthritic changes in her joints.  Skin Exam:  Shows some scattered ecchymoses.  LABORATORIES:  Were not done on this visit.  IMPRESSION:  Anne Morrow is a 66 year old white female with what we felt was recurrent non-small cell lung cancer.  She actually did have some stereotactic radiosurgery out at [facility].  She is doing well.  I just do not believe that she has any active malignancy right now.  I think we can have her hold off on coming back to the clinic.  We are really not doing much for her right now.  I told [name] that she could certainly come back at any time.  If any of her other doctors think that we need to see her, we certainly can.  [name] has always been very nice to [place].  I have always had a good time with her and good fellowship.    ______________________________ Josph Macho, M.D. PRE/MEDQ  D:  05/07/2011  T:  05/08/2011  Job:  [id number]

## 2011-06-03 ENCOUNTER — Ambulatory Visit
Admission: RE | Admit: 2011-06-03 | Discharge: 2011-06-03 | Disposition: A | Discharge status: Home / Self Care | Payer: Medicare Other | Source: Ambulatory Visit | Attending: Family Medicine | Admitting: Family Medicine

## 2012-03-29 IMAGING — CT CT BIOPSY
1 series · 15 of 31 positions shown, 19 images · non-contrast
Comparison: none

CLINICAL DATA: History of lung carcinoma with recurrent tumor.  The
largest lesion lies in the peripheral left lower lobe and
demonstrates abnormal hypermetabolic activity by PET scan.

[Series 2: lt lung mass bx · axial · 0.57mm/px · z∈[-147,-52]mm · 15 of 74 slices shown, 19 images]
[im 3/74  mediastinal]
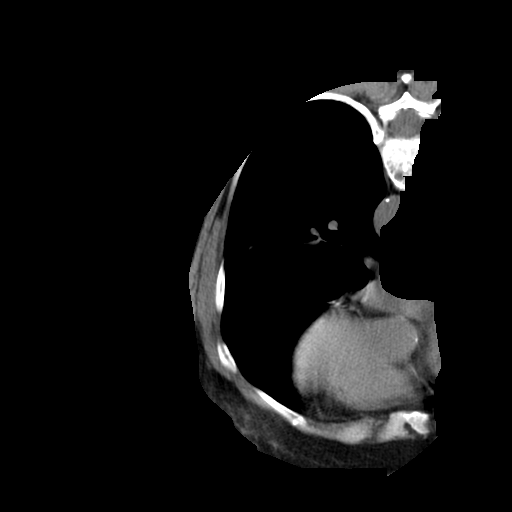
[im 3/74  lung]
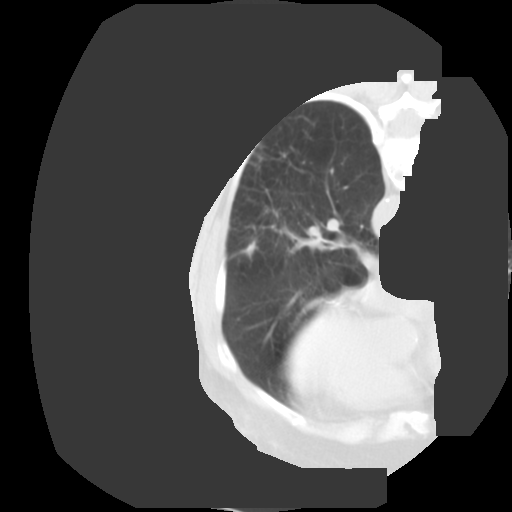
[im 9/74  lung]
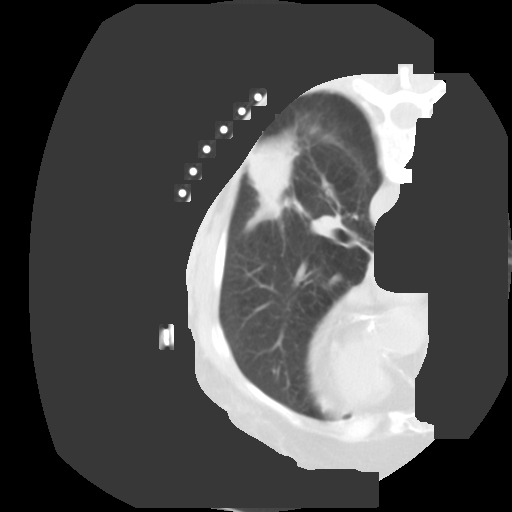
[im 14/74  lung]
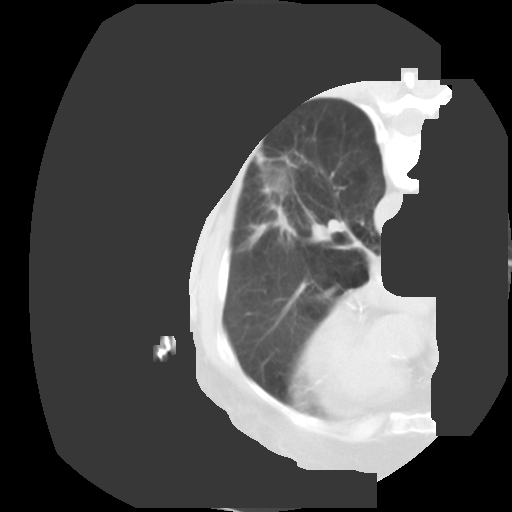
[im 17/74  lung]
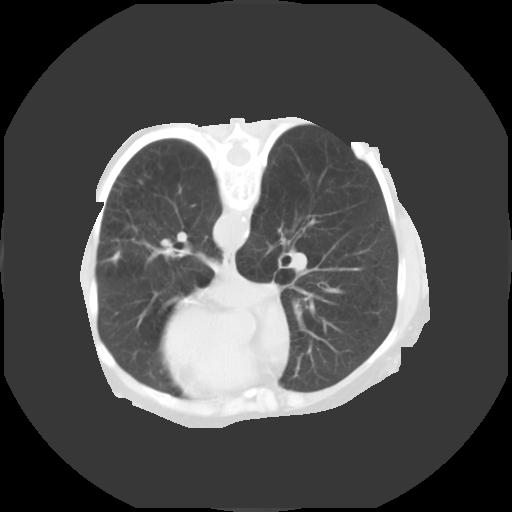
[im 22/74  mediastinal]
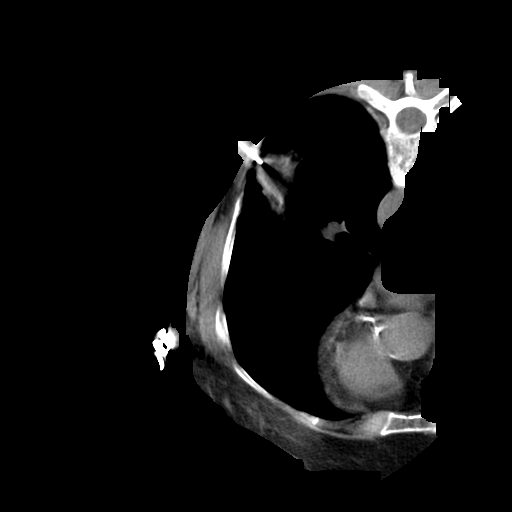
[im 22/74  lung]
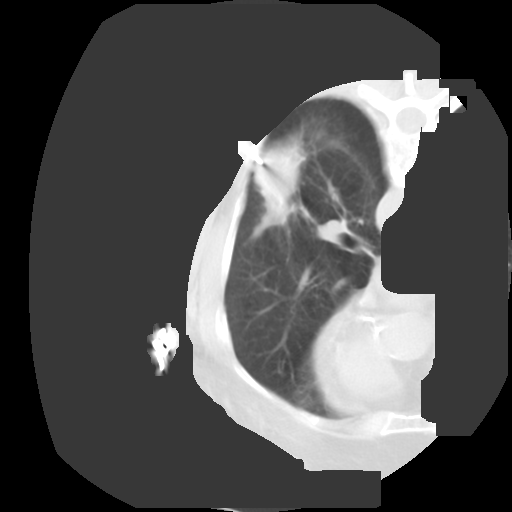
[im 28/74  lung]
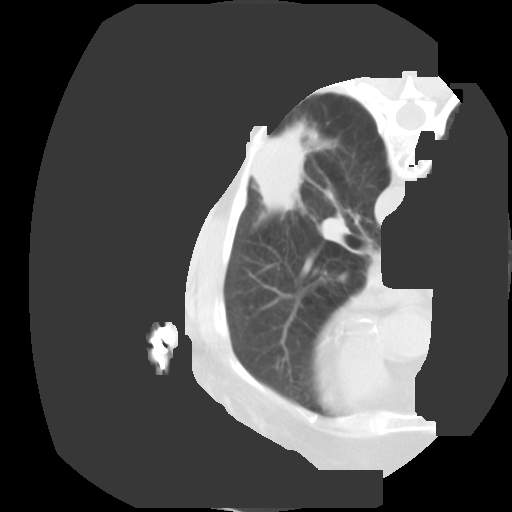
[im 33/74  lung]
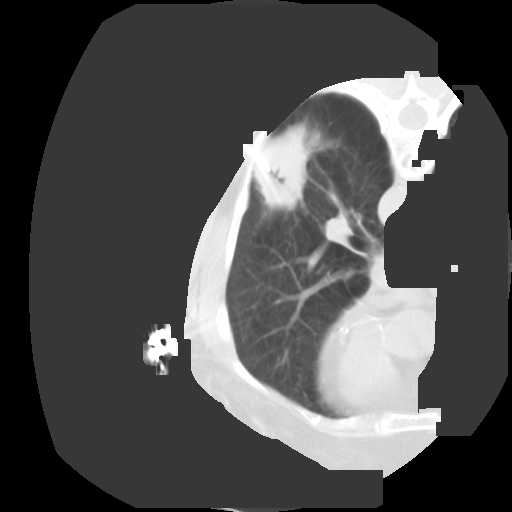
[im 38/74  lung]
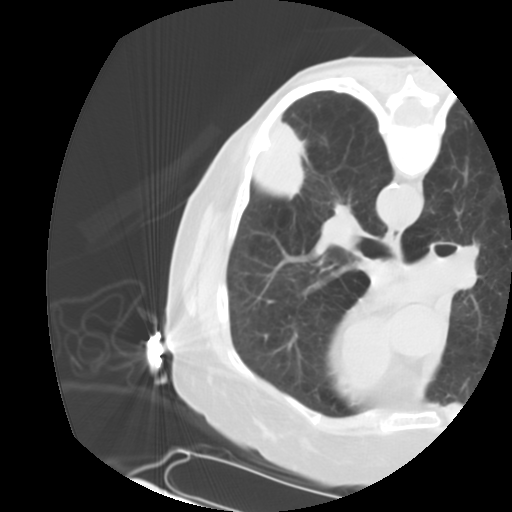
[im 44/74  mediastinal]
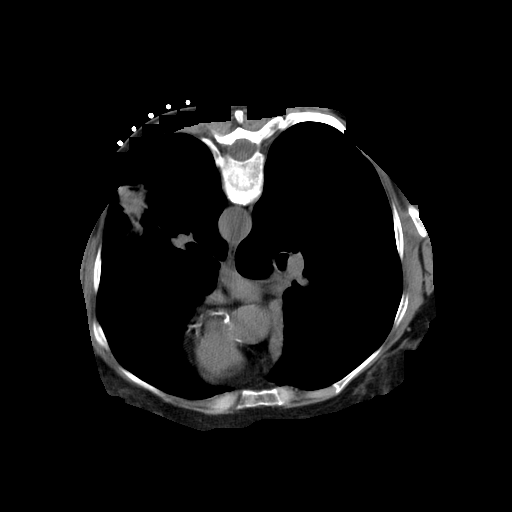
[im 44/74  lung]
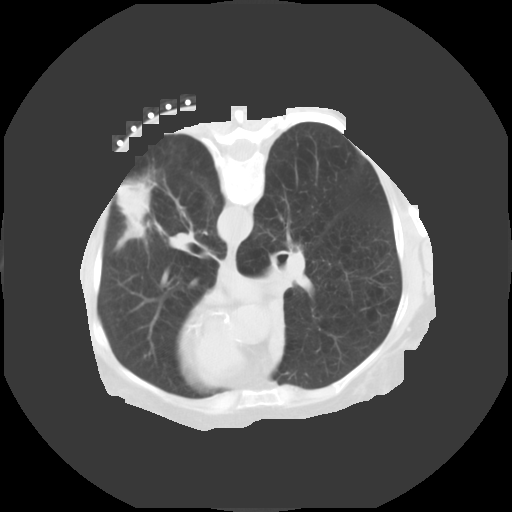
[im 49/74  lung]
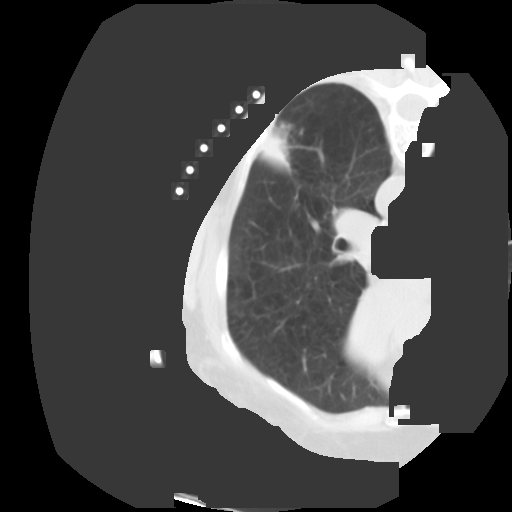
[im 55/74  lung]
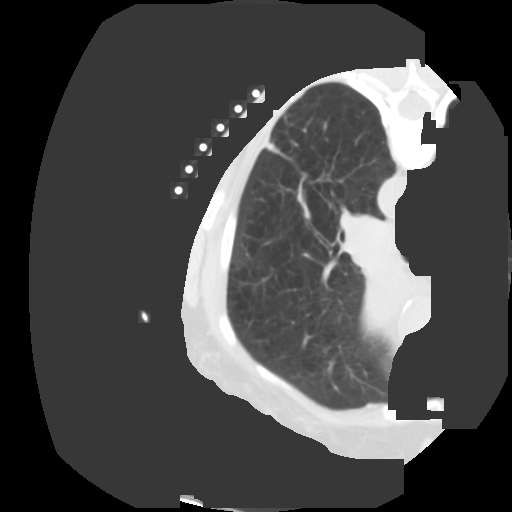
[im 57/74  lung]
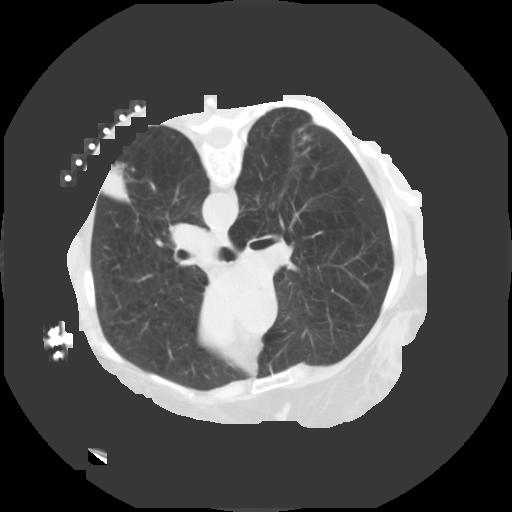
[im 60/74  mediastinal]
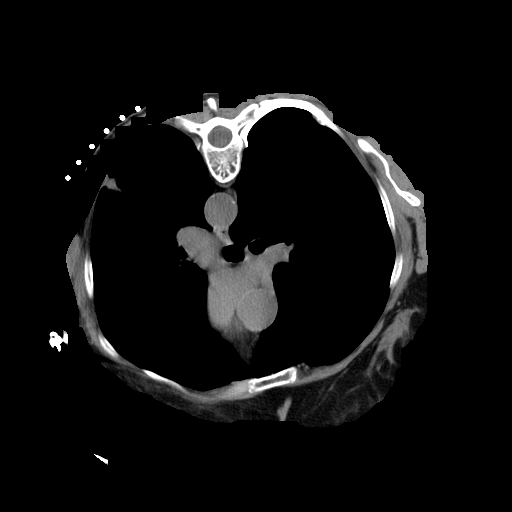
[im 60/74  lung]
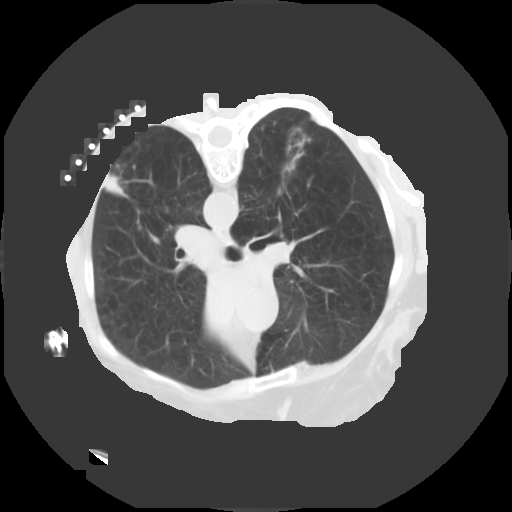
[im 65/74  lung]
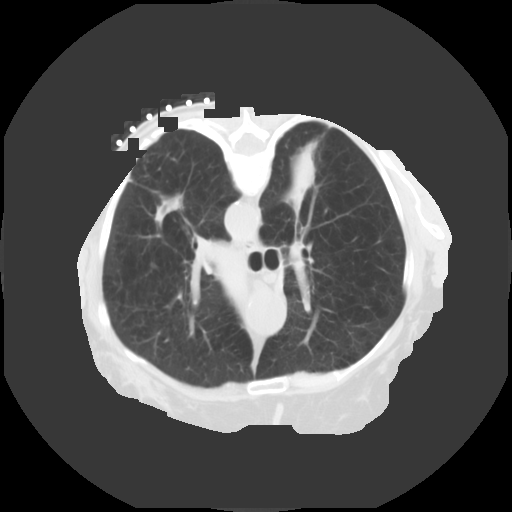
[im 71/74  lung]
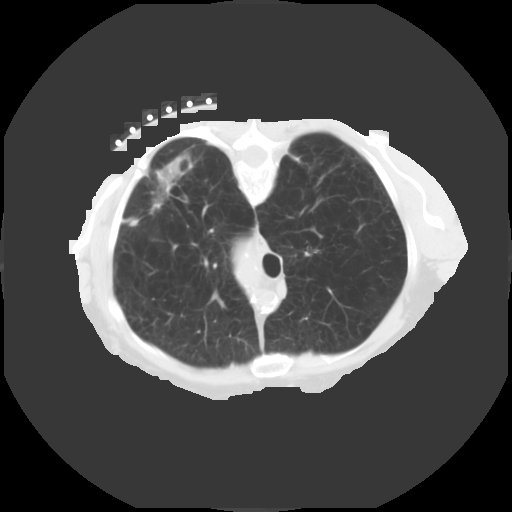

[15 of 31 positions shown; findings below may reference images not displayed]

CT GUIDED CORE BIOPSY OF LEFT LUNG MASS

Sedation:   2.0 mg IV Versed;  75 mcg IV Fentanyl

Total Moderate Sedation Time: 17 minutes.

Procedure:  The procedure risks, benefits, and alternatives were
explained to the patient.  Questions regarding the procedure were
encouraged and answered.  The patient understands and consents to
the procedure.

The the left posterolateral chest wall was prepped with betadine in
a sterile fashion, and a sterile drape was applied covering the
operative field.  A sterile gown and sterile gloves were used for
the procedure.  Local anesthesia was provided with 1% Lidocaine.

Under CT guidance, a 17 gauge trocar needle was advanced into the
left lower lobe mass.  After confirming needle tip position,
coaxial 18-gauge core biopsy samples were obtained.  A total of two
samples were submitted in formalin.  The outer needle was removed
and additional CT performed.

Complications: None.  No pneumothorax.
FINDINGS: Left lower lobe mass localized which by current CT
measures approximately 4.2 cm in greatest diameter.  Solid core
biopsy tissue samples were obtained from the lesion.  Postbiopsy CT
shows no evidence of pneumothorax or surrounding hemorrhage.  A
small amount of air was introduced into the central aspect of the
tumor.
IMPRESSION: CT guided core biopsy performed of the peripheral left lower lobe
lung mass.

## 2012-10-13 ENCOUNTER — Emergency Department (HOSPITAL_BASED_OUTPATIENT_CLINIC_OR_DEPARTMENT_OTHER): Payer: Medicare Other

## 2012-10-13 ENCOUNTER — Encounter (HOSPITAL_BASED_OUTPATIENT_CLINIC_OR_DEPARTMENT_OTHER): Payer: Self-pay | Admitting: *Deleted

## 2012-10-13 ENCOUNTER — Emergency Department (HOSPITAL_BASED_OUTPATIENT_CLINIC_OR_DEPARTMENT_OTHER)
Admission: EM | Admit: 2012-10-13 | Discharge: 2012-10-13 | Disposition: A | Discharge status: Home / Self Care | Payer: Medicare Other | Attending: Emergency Medicine | Admitting: Emergency Medicine

## 2012-10-13 DIAGNOSIS — J449 Chronic obstructive pulmonary disease, unspecified: Secondary | ICD-10-CM | POA: Insufficient documentation

## 2012-10-13 DIAGNOSIS — S92309A Fracture of unspecified metatarsal bone(s), unspecified foot, initial encounter for closed fracture: Secondary | ICD-10-CM | POA: Insufficient documentation

## 2012-10-13 DIAGNOSIS — J4489 Other specified chronic obstructive pulmonary disease: Secondary | ICD-10-CM | POA: Insufficient documentation

## 2012-10-13 DIAGNOSIS — Z79899 Other long term (current) drug therapy: Secondary | ICD-10-CM | POA: Insufficient documentation

## 2012-10-13 DIAGNOSIS — S92301A Fracture of unspecified metatarsal bone(s), right foot, initial encounter for closed fracture: Secondary | ICD-10-CM

## 2012-10-13 DIAGNOSIS — F17200 Nicotine dependence, unspecified, uncomplicated: Secondary | ICD-10-CM | POA: Insufficient documentation

## 2012-10-13 DIAGNOSIS — E785 Hyperlipidemia, unspecified: Secondary | ICD-10-CM | POA: Insufficient documentation

## 2012-10-13 DIAGNOSIS — Y92009 Unspecified place in unspecified non-institutional (private) residence as the place of occurrence of the external cause: Secondary | ICD-10-CM | POA: Insufficient documentation

## 2012-10-13 DIAGNOSIS — X503XXA Overexertion from repetitive movements, initial encounter: Secondary | ICD-10-CM | POA: Insufficient documentation

## 2012-10-13 DIAGNOSIS — I10 Essential (primary) hypertension: Secondary | ICD-10-CM | POA: Insufficient documentation

## 2012-10-13 DIAGNOSIS — Y9301 Activity, walking, marching and hiking: Secondary | ICD-10-CM | POA: Insufficient documentation

## 2012-10-13 HISTORY — DX: Essential (primary) hypertension: I10

## 2012-10-13 HISTORY — DX: Hyperlipidemia, unspecified: E78.5

## 2012-10-13 HISTORY — DX: Chronic obstructive pulmonary disease, unspecified: J44.9

## 2012-10-13 NOTE — ED Provider Notes (Signed)
CSN: [id number]     Arrival date & time 10/13/12  1544 History     First MD Initiated Contact with Patient 10/13/12 1555     Chief Complaint  Patient presents with  . Foot Injury   (Consider location/radiation/quality/duration/timing/severity/associated sxs/prior Treatment) HPI This 67 year old female was walking in her house to take the dogs outside and she stepped down onto her right foot felt a popping snapping painful sensation in her right lateral foot at the base of the fifth metatarsal region it has had localized pain and walking with a limp since that time without radiation or associated symptoms and no treatment prior to arrival, there is slight redness to that part of her foot now with no breaking the skin no new weakness although she does have minimal numbness to the injury site itself, she has no ankle pain or pain to her lower leg and the thigh or hip, she is no other injury or other concerns at this time. She does not want pain medicine at this time. Her pain is mild without palpation but moderate with limping while walking or direct palpation to the injured area. This injury occurred just prior to arrival today. Past Medical History  Diagnosis Date  . Hypertension   . COPD (chronic obstructive pulmonary disease)   . Hyperlipemia    Past Surgical History  Procedure Laterality Date  . Back surgery    . Joint replacement    . Abdominal hysterectomy    . Tubal ligation     History reviewed. No pertinent family history. History  Substance Use Topics  . Smoking status: Current Every Day Smoker -- 1.00 packs/day    Types: Cigarettes  . Smokeless tobacco: Not on file  . Alcohol Use: No   OB History   Grav Para Term Preterm Abortions TAB SAB Ect Mult Living                 Review of Systems 10 Systems reviewed and are negative for acute change except as noted in the HPI. Allergies  Fexofenadine; Loratadine; Morphine and related; and Pseudoephedrine  Home Medications    Current Outpatient Rx  Name  Route  Sig  Dispense  Refill  . folic acid (FOLVITE) 1 MG tablet   Oral   Take 1 mg by mouth daily.         . pantoprazole (PROTONIX) 40 MG tablet   Oral   Take 40 mg by mouth daily.         Marland Kitchen albuterol (PROVENTIL) 90 MCG/ACT inhaler   Inhalation   Inhale 2 puffs into the lungs every 6 (six) hours as needed.           Marland Kitchen alendronate (FOSAMAX) 70 MG tablet   Oral   Take 70 mg by mouth every 7 (seven) days. Take with a full glass of water on an empty stomach.          . ALPRAZolam (XANAX) 0.5 MG tablet   Oral   Take 0.5 mg by mouth at bedtime as needed.           . budesonide-formoterol (SYMBICORT) 80-4.5 MCG/ACT inhaler   Inhalation   Inhale 2 puffs into the lungs 2 (two) times daily.           . calcium-vitamin D (OSCAL) 250-125 MG-UNIT per tablet   Oral   Take 1 tablet by mouth daily.           . clopidogrel (PLAVIX) 75 MG tablet  Oral   Take 75 mg by mouth daily.           . ferrous sulfate 325 (65 FE) MG EC tablet   Oral   Take 325 mg by mouth Nightly.          Marland Kitchen HYDROcodone-acetaminophen (VICODIN) 5-500 MG per tablet   Oral   Take 1 tablet by mouth every 6 (six) hours as needed.           Marland Kitchen ipratropium (ATROVENT) 0.02 % nebulizer solution   Inhalation   Inhale 2 mg into the lungs 4 (four) times daily.           Marland Kitchen levothyroxine (SYNTHROID, LEVOTHROID) 25 MCG tablet   Oral   Take 25 mcg by mouth daily.           . mometasone (NASONEX) 50 MCG/ACT nasal spray   Nasal   Place 2 sprays into the nose daily.           . ramipril (ALTACE) 2.5 MG tablet   Oral   Take 2.5 mg by mouth daily.           . rosuvastatin (CRESTOR) 20 MG tablet   Oral   Take 20 mg by mouth daily.            BP 111/52  Pulse 76  Temp(Src) 98.2 F (36.8 C) (Oral)  Resp 16  Ht 5\' 2"  (1.575 m)  Wt 104 lb (47.174 kg)  BMI 19.02 kg/m2  SpO2 100% Physical Exam  Nursing note and vitals reviewed. Constitutional:  Awake,  alert, nontoxic appearance.  HENT:  Head: Atraumatic.  Eyes: Right eye exhibits no discharge. Left eye exhibits no discharge.  Neck: Neck supple.  Pulmonary/Chest: Effort normal. She exhibits no tenderness.  Abdominal: Soft. There is no tenderness. There is no rebound.  Musculoskeletal: She exhibits tenderness. She exhibits no edema.  Baseline ROM, no obvious new focal weakness. No tenderness to cervical spine, arms, or left leg. Right leg is no tenderness to the hip, thigh, knee, calf, Achilles tendon, ankle, medial foot, forefoot, she is isolated tenderness to the right lateral foot at the base of a fifth metatarsal region with no swelling or deformity noted, she has normal light touch to her right foot except for slight numbness at the injury site, she has capillary refill less than 2 seconds in all toes dorsalis pedis pulses also intact she has normal light touch and good movement to the foot.  Neurological: She is alert.  Mental status and motor strength appears baseline for patient and situation.  Skin: No rash noted.  Psychiatric: She has a normal mood and affect.    ED Course  Pt does not want non-weight-bearing splint.Patient / Family / Caregiver informed of clinical course, understand medical decision-making process, and agree with plan. Procedures (including critical care time)  Labs Reviewed - No data to display Dg Foot Complete Right  10/13/2012   *RADIOLOGY REPORT*  Clinical Data: Injured right foot on steps today, lateral pain  RIGHT FOOT COMPLETE - 3+ VIEW  Comparison: None  Findings: Osseous mineralization diffusely decreased. Joint spaces preserved. Transverse nondisplaced fracture at base of fifth metatarsal. No additional fracture, dislocation or bone destruction.  IMPRESSION: Transverse nondisplaced fracture at base of right fifth metatarsal.   Original Report Authenticated By: Ulyses Southward, M.D.   1. Fracture of fifth metatarsal bone, right, closed, initial encounter      MDM  I doubt any other [facility] precluding discharge at this time.  Hurman Horn, MD 10/14/12 1256

## 2012-10-13 NOTE — ED Notes (Signed)
Pt c/o right foot injury x 8 hrs ago

## 2012-12-15 ENCOUNTER — Other Ambulatory Visit: Payer: Self-pay | Admitting: Family Medicine

## 2012-12-15 DIAGNOSIS — I639 Cerebral infarction, unspecified: Secondary | ICD-10-CM

## 2012-12-19 ENCOUNTER — Ambulatory Visit
Admission: RE | Admit: 2012-12-19 | Discharge: 2012-12-19 | Disposition: A | Discharge status: Home / Self Care | Payer: Medicare Other | Source: Ambulatory Visit | Attending: Family Medicine | Admitting: Family Medicine

## 2012-12-19 DIAGNOSIS — I639 Cerebral infarction, unspecified: Secondary | ICD-10-CM

## 2012-12-30 ENCOUNTER — Observation Stay (HOSPITAL_BASED_OUTPATIENT_CLINIC_OR_DEPARTMENT_OTHER)
Admission: EM | Admit: 2012-12-30 | Discharge: 2012-12-30 | Disposition: A | Discharge status: Other Acute Inpatient Hospital | Payer: Medicare Other | Attending: Emergency Medicine | Admitting: Emergency Medicine

## 2012-12-30 ENCOUNTER — Encounter (HOSPITAL_BASED_OUTPATIENT_CLINIC_OR_DEPARTMENT_OTHER): Payer: Self-pay | Admitting: Emergency Medicine

## 2012-12-30 ENCOUNTER — Emergency Department (HOSPITAL_BASED_OUTPATIENT_CLINIC_OR_DEPARTMENT_OTHER): Payer: Medicare Other

## 2012-12-30 DIAGNOSIS — J4489 Other specified chronic obstructive pulmonary disease: Secondary | ICD-10-CM | POA: Insufficient documentation

## 2012-12-30 DIAGNOSIS — I10 Essential (primary) hypertension: Secondary | ICD-10-CM | POA: Insufficient documentation

## 2012-12-30 DIAGNOSIS — R079 Chest pain, unspecified: Principal | ICD-10-CM | POA: Insufficient documentation

## 2012-12-30 DIAGNOSIS — J449 Chronic obstructive pulmonary disease, unspecified: Secondary | ICD-10-CM | POA: Insufficient documentation

## 2012-12-30 DIAGNOSIS — Z79899 Other long term (current) drug therapy: Secondary | ICD-10-CM | POA: Insufficient documentation

## 2012-12-30 DIAGNOSIS — E785 Hyperlipidemia, unspecified: Secondary | ICD-10-CM | POA: Insufficient documentation

## 2012-12-30 DIAGNOSIS — R0602 Shortness of breath: Secondary | ICD-10-CM | POA: Insufficient documentation

## 2012-12-30 DIAGNOSIS — Z7902 Long term (current) use of antithrombotics/antiplatelets: Secondary | ICD-10-CM | POA: Insufficient documentation

## 2012-12-30 LAB — CBC WITH DIFFERENTIAL/PLATELET
Basophils Relative: 0 % (ref 0–1)
Eosinophils Relative: 2 % (ref 0–5)
HCT: 39.5 % (ref 36.0–46.0)
Lymphocytes Relative: 26 % (ref 12–46)
Lymphs Abs: 2.4 10*3/uL (ref 0.7–4.0)
MCV: 100 fL (ref 78.0–100.0)
Monocytes Absolute: 0.8 10*3/uL (ref 0.1–1.0)
RBC: 3.95 MIL/uL (ref 3.87–5.11)
WBC: 9.3 10*3/uL (ref 4.0–10.5)

## 2012-12-30 LAB — BASIC METABOLIC PANEL
BUN: 18 mg/dL (ref 6–23)
CO2: 27 mEq/L (ref 19–32)
Calcium: 9.6 mg/dL (ref 8.4–10.5)
Creatinine, Ser: 0.8 mg/dL (ref 0.50–1.10)
GFR calc Af Amer: 86 mL/min — ABNORMAL LOW (ref >90)
GFR calc non Af Amer: 74 mL/min — ABNORMAL LOW (ref >90)
Glucose, Bld: 138 mg/dL — ABNORMAL HIGH (ref 70–99)

## 2012-12-30 LAB — PROTIME-INR: Prothrombin Time: 12.6 seconds (ref 11.6–15.2)

## 2012-12-30 MED ORDER — NITROGLYCERIN 0.4 MG SL SUBL
0.4000 mg | SUBLINGUAL_TABLET | SUBLINGUAL | Status: DC | PRN
  Administered 2012-12-30: 0.4 mg via SUBLINGUAL
  Filled 2012-12-30 (×2): qty 25

## 2012-12-30 MED ORDER — ASPIRIN 81 MG PO CHEW
324.0000 mg | CHEWABLE_TABLET | Freq: Once | ORAL | Status: AC
  Administered 2012-12-30: 324 mg via ORAL
  Filled 2012-12-30: qty 4

## 2012-12-30 NOTE — ED Notes (Signed)
HP1 here, report given to Lynett Grimes, EMT-P.  No questions voiced.  They have assumed care of the patient at this time.

## 2012-12-30 NOTE — ED Provider Notes (Signed)
CSN: [id number]     Arrival date & time 12/30/12  1330 History   First MD Initiated Contact with Patient 12/30/12 1338     Chief Complaint  Patient presents with  . Chest Pain  . Shortness of Breath   (Consider location/radiation/quality/duration/timing/severity/associated sxs/prior Treatment) Patient is a 67 y.o. female presenting with chest pain and shortness of breath.  Chest Pain Associated symptoms: shortness of breath   Shortness of Breath Associated symptoms: chest pain    Pt with history of HTN, HLD and COPD has had about 4 days of intermittent moderate to severe chest pressure, associated with SOB, comes and goes. No particular provoking or relieving factors. Has also had room spinning dizziness during that time, worse with lying flat and walking, unsure if the two are related or not. No previous history of CAD, but has had stroke in the past.   Past Medical History  Diagnosis Date  . Hypertension   . COPD (chronic obstructive pulmonary disease)   . Hyperlipemia    Past Surgical History  Procedure Laterality Date  . Back surgery    . Joint replacement    . Abdominal hysterectomy    . Tubal ligation     History reviewed. No pertinent family history. History  Substance Use Topics  . Smoking status: Current Every Day Smoker -- 1.00 packs/day    Types: Cigarettes  . Smokeless tobacco: Not on file  . Alcohol Use: No   OB History   Grav Para Term Preterm Abortions TAB SAB Ect Mult Living                 Review of Systems  Respiratory: Positive for shortness of breath.   Cardiovascular: Positive for chest pain.   All other systems reviewed and are negative except as noted in HPI.   Allergies  Fexofenadine; Loratadine; Morphine and related; and Pseudoephedrine  Home Medications   Current Outpatient Rx  Name  Route  Sig  Dispense  Refill  . albuterol (PROVENTIL) 90 MCG/ACT inhaler   Inhalation   Inhale 2 puffs into the lungs every 6 (six) hours as needed.           Marland Kitchen alendronate (FOSAMAX) 70 MG tablet   Oral   Take 70 mg by mouth every 7 (seven) days. Take with a full glass of water on an empty stomach.          . ALPRAZolam (XANAX) 0.5 MG tablet   Oral   Take 0.5 mg by mouth at bedtime as needed.           . budesonide-formoterol (SYMBICORT) 80-4.5 MCG/ACT inhaler   Inhalation   Inhale 2 puffs into the lungs 2 (two) times daily.           . calcium-vitamin D (OSCAL) 250-125 MG-UNIT per tablet   Oral   Take 1 tablet by mouth daily.           . clopidogrel (PLAVIX) 75 MG tablet   Oral   Take 75 mg by mouth daily.           . ferrous sulfate 325 (65 FE) MG EC tablet   Oral   Take 325 mg by mouth Nightly.          . folic acid (FOLVITE) 1 MG tablet   Oral   Take 1 mg by mouth daily.         Marland Kitchen HYDROcodone-acetaminophen (VICODIN) 5-500 MG per tablet   Oral   Take 1  tablet by mouth every 6 (six) hours as needed.           Marland Kitchen ipratropium (ATROVENT) 0.02 % nebulizer solution   Inhalation   Inhale 2 mg into the lungs 4 (four) times daily.           Marland Kitchen levothyroxine (SYNTHROID, LEVOTHROID) 25 MCG tablet   Oral   Take 25 mcg by mouth daily.           . mometasone (NASONEX) 50 MCG/ACT nasal spray   Nasal   Place 2 sprays into the nose daily.           . pantoprazole (PROTONIX) 40 MG tablet   Oral   Take 40 mg by mouth daily.         . ramipril (ALTACE) 2.5 MG tablet   Oral   Take 2.5 mg by mouth daily.           . rosuvastatin (CRESTOR) 20 MG tablet   Oral   Take 20 mg by mouth daily.            BP 95/55  Pulse 93  Temp(Src) 98.1 F (36.7 C) (Oral)  Resp 22  Ht 5\' 2"  (1.575 m)  Wt 100 lb (45.36 kg)  BMI 18.29 kg/m2  SpO2 100% Physical Exam  Nursing note and vitals reviewed. Constitutional: She is oriented to person, place, and time. She appears well-developed and well-nourished.  HENT:  Head: Normocephalic and atraumatic.  Eyes: EOM are normal. Pupils are equal, round, and reactive  to light.  Neck: Normal range of motion. Neck supple.  Cardiovascular: Normal rate, normal heart sounds and intact distal pulses.   Pulmonary/Chest: Effort normal and breath sounds normal.  Abdominal: Bowel sounds are normal. She exhibits no distension. There is no tenderness.  Musculoskeletal: Normal range of motion. She exhibits no edema and no tenderness.  Neurological: She is alert and oriented to person, place, and time. She has normal strength. No cranial nerve deficit or sensory deficit.  Skin: Skin is warm and dry. No rash noted.  Psychiatric: She has a normal mood and affect.    ED Course  Procedures (including critical care time) Labs Review Labs Reviewed  BASIC METABOLIC PANEL - Abnormal; Notable for the following:    Glucose, Bld 138 (*)    GFR calc non Af Amer 74 (*)    GFR calc Af Amer 86 (*)    All other components within normal limits  CBC WITH DIFFERENTIAL  TROPONIN I  PROTIME-INR  APTT   Imaging Review Dg Chest 2 View  12/30/2012   CLINICAL DATA:  Chest pain, shortness of breath.  EXAM: CHEST  2 VIEW  COMPARISON:  February 26, 2010.  FINDINGS: Cardiomediastinal silhouette appears normal. Hyperexpansion of the lungs is noted suggesting chronic obstructive pulmonary disease. No pleural effusion or pneumothorax is noted. Old left rib fractures are again noted. Left lung mass noted on prior exam is no longer visualized. Scarring is noted in left perihilar region. Right suprahilar nodule is again noted and not significantly changed. Probable old right 7th rib fracture is noted. Nodular density is seen in left suprahilar region which is unchanged compared to prior exam.  IMPRESSION: Left lung mass noted on prior exam is no longer visualized. Right suprahilar nodule is again noted and stable. Left suprahilar nodule is also noted and stable compared to prior exam. Hyperexpansion of the lungs is noted suggesting chronic obstructive pulmonary disease.   Electronically Signed   By:  Fayrene Fearing  Green M.D.   On: 12/30/2012 14:41    EKG Interpretation   None       MDM   1. Chest pain      Date: 12/30/2012  Rate: 87  Rhythm: normal sinus rhythm and premature ventricular contractions (PVC)  QRS Axis: normal  Intervals: normal  ST/T Wave abnormalities: normal  Conduction Disutrbances:none  Narrative Interpretation:   Old EKG Reviewed: unchanged  EKG and Trop neg. Pt with risk factors, will plan for admission for rule out. Spoke with Dr. Vanessa Barbara, hospitalist, who will accept for admission.    Charles B. Bernette Mayers, MD 12/30/12 1511

## 2012-12-30 NOTE — ED Notes (Signed)
Patient came in on 2L home O2. Patient put on 2L O2 in ED. Patients O2 tank given to daughter.

## 2012-12-30 NOTE — ED Notes (Signed)
Pt having chest pain across both sides of chest, radiating to back.  Some sob and lightheadedness.  No N/V or diaphoresis.

## 2012-12-30 NOTE — ED Notes (Signed)
Pt was ambulated to the BR on request.

## 2013-01-17 ENCOUNTER — Encounter: Payer: Medicare Other | Admitting: Vascular Surgery

## 2013-02-01 ENCOUNTER — Encounter: Payer: Self-pay | Admitting: Vascular Surgery

## 2013-02-02 ENCOUNTER — Ambulatory Visit (INDEPENDENT_AMBULATORY_CARE_PROVIDER_SITE_OTHER): Payer: Medicare Other | Admitting: Vascular Surgery

## 2013-02-02 ENCOUNTER — Encounter: Payer: Self-pay | Admitting: Vascular Surgery

## 2013-02-02 DIAGNOSIS — I6529 Occlusion and stenosis of unspecified carotid artery: Secondary | ICD-10-CM

## 2013-02-02 DIAGNOSIS — Z01810 Encounter for preprocedural cardiovascular examination: Secondary | ICD-10-CM

## 2013-02-02 MED ORDER — CLOPIDOGREL BISULFATE 75 MG PO TABS: 75.0000 mg | ORAL_TABLET | Freq: Every day | ORAL | Status: AC

## 2013-02-02 NOTE — Progress Notes (Signed)
[facility] [place]  New Carotid Patient  Referred by:  Cain Saupe, MD [address] [place] [address],  Reason for referral: prior stroke  History of Present Illness  Anne Morrow is a 67 y.o. (July 14, 1945) female who presents with chief complaint: prior strokes.  Previous carotid studies demonstrated: RICA <50% stenosis, LICA <50% stenosis, but M-mode findings not consistent with such.  Patient has history of TIA x 3: last in 2002.  The patient had right hand weakness then right arm weakness then right leg paralysis which resolved.   The patient has never had amaurosis fugax or monocular blindness.  The patient has never had facial drooping.  The patient has never had receptive or expressive aphasia.   The patient's previous neurologic deficits have resolved.  The patient's risks factors for carotid disease include: HTN, hyperlipidemia, and prior significant smoking history.  Past Medical History  Diagnosis Date  . Hypertension   . COPD (chronic obstructive pulmonary disease)   . Hyperlipemia   . Carotid artery occlusion   . Anemia   . Stroke     Past Surgical History  Procedure Laterality Date  . Back surgery    . Joint replacement    . Abdominal hysterectomy    . Tubal ligation    . Hemorrhoid surgery  1985  . Cataract extraction w/ intraocular lens  implant, bilateral Bilateral     R 1998, L 2000  . Shoulder arthroscopy Left     History   Social History  . Marital Status: Widowed    Spouse Name: N/A    Number of Children: N/A  . Years of Education: N/A   Occupational History  . Not on file.   Social History Main Topics  . Smoking status: Current Every Day Smoker -- 1.00 packs/day for 60 years    Types: Cigarettes  . Smokeless tobacco: Not on file     Comment: smoking cessation info given and reviewed  . Alcohol Use: No  . Drug Use: No  . Sexual Activity: No   Other Topics Concern  . Not on file   Social History  Narrative  . No narrative on file    Family History: Patient not aware of mother and father's medical history  Current Outpatient Prescriptions on File Prior to Visit  Medication Sig Dispense Refill  . albuterol (PROVENTIL) 90 MCG/ACT inhaler Inhale 2 puffs into the lungs every 6 (six) hours as needed.        . ALPRAZolam (XANAX) 0.5 MG tablet Take 0.5 mg by mouth at bedtime as needed.        . budesonide-formoterol (SYMBICORT) 80-4.5 MCG/ACT inhaler Inhale 2 puffs into the lungs 2 (two) times daily.        . clopidogrel (PLAVIX) 75 MG tablet Take 75 mg by mouth daily.        . ferrous sulfate 325 (65 FE) MG EC tablet Take 325 mg by mouth Nightly.       . folic acid (FOLVITE) 1 MG tablet Take 1 mg by mouth daily.      Marland Kitchen HYDROcodone-acetaminophen (VICODIN) 5-500 MG per tablet Take 1 tablet by mouth every 6 (six) hours as needed.        Marland Kitchen ipratropium (ATROVENT) 0.02 % nebulizer solution Inhale 2 mg into the lungs 4 (four) times daily.        Marland Kitchen levothyroxine (SYNTHROID, LEVOTHROID) 25 MCG tablet Take 25 mcg by mouth daily.        Marland Kitchen  mometasone (NASONEX) 50 MCG/ACT nasal spray Place 2 sprays into the nose daily.        . pantoprazole (PROTONIX) 40 MG tablet Take 40 mg by mouth daily.      . ramipril (ALTACE) 2.5 MG tablet Take 2.5 mg by mouth daily.        . rosuvastatin (CRESTOR) 20 MG tablet Take 20 mg by mouth daily.        Marland Kitchen alendronate (FOSAMAX) 70 MG tablet Take 70 mg by mouth every 7 (seven) days. Take with a full glass of water on an empty stomach.       . calcium-vitamin D (OSCAL) 250-125 MG-UNIT per tablet Take 1 tablet by mouth daily.         No current facility-administered medications on file prior to visit.    Allergies  Allergen Reactions  . Fexofenadine   . Loratadine   . Morphine And Related   . Pseudoephedrine     REVIEW OF SYSTEMS:  (Positives checked otherwise negative)  CARDIOVASCULAR:  []  chest pain, []  chest pressure, []  palpitations, []  shortness of breath when  laying flat, []  shortness of breath with exertion,  []  pain in feet when walking, []  pain in feet when laying flat, []  history of blood clot in veins (DVT), []  history of phlebitis, []  swelling in legs, []  varicose veins  PULMONARY:  []  productive cough, []  asthma, []  wheezing  NEUROLOGIC:  []  weakness in arms or legs, []  numbness in arms or legs, []  difficulty speaking or slurred speech, []  temporary loss of vision in one eye, []  dizziness  HEMATOLOGIC:  []  bleeding problems, []  problems with blood clotting too easily  MUSCULOSKEL:  []  joint pain, []  joint swelling  GASTROINTEST:  []  vomiting blood, []  blood in stool     GENITOURINARY:  []  burning with urination, []  blood in urine  PSYCHIATRIC:  []  history of major depression  INTEGUMENTARY:  []  rashes, []  ulcers  CONSTITUTIONAL:  []  fever, []  chills  For VQI Use Only  PRE-ADM LIVING: Home  AMB STATUS: Ambulatory  CAD Sx: None  PRIOR CHF: None  STRESS TEST: [x]  No, [ ]  Normal, [ ]  + ischemia, [ ]  + MI, [ ]  Both  Physical Examination  Filed Vitals:   02/02/13 0956 02/02/13 1004  BP: 99/60 105/49  Pulse: 95   Height: 5\' 2"  (1.575 m)   Weight: 99 lb 6.4 oz (45.088 kg)   SpO2: 100%    Body mass index is 18.18 kg/(m^2).  General: A&O x 3, elderly, ill appearing  Head: [place]/AT  Ear/Nose/Throat: Hearing grossly intact, nares w/o erythema or drainage, oropharynx w/o Erythema/Exudate, Mallampati score: 3  Eyes: PERRLA, EOMI  Neck: Supple, no nuchal rigidity, no palpable LAD  Pulmonary: Sym exp, good air movt, CTAB, no rales, rhonchi, & wheezing  Cardiac: RRR, Nl S1, S2, no Murmurs, rubs or gallops  Vascular: Vessel Right Left  Radial Palpable Palpable  Brachial Palpable Palpable  Carotid Palpable, without bruit Palpable, without bruit  Aorta Not palpable N/A  Femoral Palpable Palpable  Popliteal Not palpable Not palpable  PT Palpable Palpable  DP Not Palpable Faintly Palpable   Gastrointestinal: soft, NTND,  -G/R, - HSM, - masses, - CVAT B, aorta not palpable  Musculoskeletal: M/S 5/5 throughout , Extremities without ischemic changes   Neurologic: CN 2-12 grossly intact , Pain and light touch intact in extremities , Motor exam as listed above  Psychiatric: Judgment intact, Mood & affect appropriate for pt's clinical situation  Dermatologic: See M/S exam for extremity exam, no rashes otherwise noted  Lymph : No Cervical, Axillary, or Inguinal lymphadenopathy   Non-Invasive Vascular Imaging  Outside CAROTID DUPLEX (Date: 12/19/12):   R ICA stenosis: <50%  R VA: patent and antegrade  L ICA stenosis: <50%  L VA: patent and antegrade  M-mode findings consistent with significant disease in both carotid arteries  I reviewed the outside report, and agree that there is a disparity between the velocities and M-mode findings  Outside Studies/Documentation 6 pages of outside documents were reviewed including: outpatient clinical chart and outside carotid duplex report.  Medical Decision Making  Anne Morrow is a 67 y.o. female who presents with: distant history of R sided TIA, possible asx B ICA stenoses, severe COPD   Based on the patient's vascular studies and examination, I have offered the patient: CTA Neck.  The carotid duplex results are likely misleading due to extensive calcification in the B carotid arteries.  The patient refuses a carotid angiogram, so CTA Neck is the next best study.  I discussed in depth with the patient the nature of atherosclerosis, and emphasized the importance of maximal medical management including strict control of blood pressure, blood glucose, and lipid levels, obtaining regular exercise, antiplatelet agents, and cessation of smoking.   The patient is currently on a statin: Crestor.  The patient is currently on an anti-platelet: ASA.  I recommended she resume her Plavix in place of ASA.  The patient is aware that without maximal medical management  the underlying atherosclerotic disease process will progress, limiting the benefit of any interventions.  Thank you for allowing [place] to participate in this patient's care.  Leonides Sake, MD [facility] [place] Office: [phone] Pager: [phone]  02/02/2013, 10:26 AM

## 2013-02-02 NOTE — Addendum Note (Signed)
Addended by: Sharee Pimple on: 02/02/2013 11:53 AM   Modules accepted: Orders

## 2013-02-19 ENCOUNTER — Other Ambulatory Visit: Payer: Self-pay | Admitting: Vascular Surgery

## 2013-02-19 LAB — CREATININE, SERUM: Creat: 0.89 mg/dL (ref 0.50–1.10)

## 2013-02-19 LAB — BUN: BUN: 14 mg/dL (ref 6–23)

## 2013-02-22 ENCOUNTER — Encounter: Payer: Self-pay | Admitting: Vascular Surgery

## 2013-02-23 ENCOUNTER — Other Ambulatory Visit: Payer: Self-pay | Admitting: *Deleted

## 2013-02-23 ENCOUNTER — Ambulatory Visit (INDEPENDENT_AMBULATORY_CARE_PROVIDER_SITE_OTHER): Payer: Medicare Other | Admitting: Vascular Surgery

## 2013-02-23 ENCOUNTER — Ambulatory Visit
Admission: RE | Admit: 2013-02-23 | Discharge: 2013-02-23 | Disposition: A | Discharge status: Home / Self Care | Payer: Medicare Other | Source: Ambulatory Visit | Attending: Vascular Surgery | Admitting: Vascular Surgery

## 2013-02-23 ENCOUNTER — Encounter: Payer: Self-pay | Admitting: Vascular Surgery

## 2013-02-23 DIAGNOSIS — I6529 Occlusion and stenosis of unspecified carotid artery: Secondary | ICD-10-CM

## 2013-02-23 DIAGNOSIS — Z01810 Encounter for preprocedural cardiovascular examination: Secondary | ICD-10-CM

## 2013-02-23 MED ORDER — IOHEXOL 350 MG/ML SOLN
100.0000 mL | Freq: Once | INTRAVENOUS | Status: AC | PRN
  Administered 2013-02-23: 100 mL via INTRAVENOUS

## 2013-02-23 NOTE — Progress Notes (Signed)
[facility] [place]  Established Carotid Patient  History of Present Illness  Anne Morrow is a 67 y.o. (1945-06-06) female who presents with chief complaint: return from neck CTA.  Previous carotid studies demonstrated: RICA <50% stenosis, LICA <50% stenosis.  The patient had conflicting findings in the B carotid duplex between the velocities and M-mode findings, hence a CTA was ordered.  The patient's previous neurologic deficits have resolved and she has not had any more events over the last 5 years..  The patient's PMH, PSH, SH, FamHx, Med, and Allergies are unchanged from 02/02/13.  On ROS today: No CVA or TIA sx, no renal sx  Physical Examination  Filed Vitals:   02/23/13 1232 02/23/13 1234  BP: 110/57 112/61  Pulse: 80   Height: 5\' 2"  (1.575 m)   Weight: 101 lb 12.8 oz (46.176 kg)   SpO2: 100%    Body mass index is 18.61 kg/(m^2).  General: A&O x 3, elderly, ill appearing   Eyes: PERRLA, EOMI   Neck: Supple, no nuchal rigidity, no palpable LAD   Pulmonary: Sym exp, good air movt, CTAB, no rales, rhonchi, & wheezing , on portable oxygen  Cardiac: RRR, Nl S1, S2, no Murmurs, rubs or gallops   Vascular:  Vessel  Right  Left   Radial  Palpable  Palpable   Brachial  Palpable  Palpable   Carotid  Palpable, without bruit  Palpable, without bruit   Aorta  Not palpable  N/A   Femoral  Palpable  Palpable   Popliteal  Not palpable  Not palpable   PT  Palpable  Palpable   DP  Not Palpable  Faintly Palpable    Gastrointestinal: soft, NTND, -G/R, - HSM, - masses, - CVAT B, aorta not palpable   Musculoskeletal: M/S 5/5 throughout , Extremities without ischemic changes   Neurologic: CN 2-12 grossly intact , Pain and light touch intact in extremities , Motor exam as listed above   CTA Neck (02/23/2013)  Official report not available.  Based on my review of CTA, the patient hs extensive aortic arch atherosclerosis with proximal disease without  significant hemodynamic stenoses.  She has extensive calcification of both carotid bifurcation (R>>L).  Neither ICA appears to have stenosis >80%.  The L ICA disease appears less extensive than the R ICA.  I don't seen any soft plaque within either bifurcation.  Medical Decision Making  Anne Morrow is a 67 y.o. female who presents with: asx B ICA stenosis <80%.   Based on the patient's vascular studies and examination, I have offered the patient: q6 month B carotid duplex.  I discussed in depth with the patient the nature of atherosclerosis, and emphasized the importance of maximal medical management including strict control of blood pressure, blood glucose, and lipid levels, antiplatelet agents, obtaining regular exercise, and cessation of smoking.    The patient is aware that without maximal medical management the underlying atherosclerotic disease process will progress, limiting the benefit of any interventions. The patient is currently on a statin: Crestor The patient is currently on an anti-platelet: ASA.  Thank you for allowing [place] to participate in this patient's care.  Leonides Sake, MD [facility] [place] Office: [phone] Pager: [phone]  02/23/2013, 1:10 PM

## 2013-07-21 ENCOUNTER — Emergency Department (HOSPITAL_BASED_OUTPATIENT_CLINIC_OR_DEPARTMENT_OTHER)
Admission: EM | Admit: 2013-07-21 | Discharge: 2013-07-22 | Disposition: A | Discharge status: Home / Self Care | Payer: Medicare Other | Attending: Emergency Medicine | Admitting: Emergency Medicine

## 2013-07-21 ENCOUNTER — Encounter (HOSPITAL_BASED_OUTPATIENT_CLINIC_OR_DEPARTMENT_OTHER): Payer: Self-pay | Admitting: Emergency Medicine

## 2013-07-21 ENCOUNTER — Emergency Department (HOSPITAL_BASED_OUTPATIENT_CLINIC_OR_DEPARTMENT_OTHER): Payer: Medicare Other

## 2013-07-21 DIAGNOSIS — IMO0002 Reserved for concepts with insufficient information to code with codable children: Secondary | ICD-10-CM | POA: Insufficient documentation

## 2013-07-21 DIAGNOSIS — R071 Chest pain on breathing: Secondary | ICD-10-CM | POA: Insufficient documentation

## 2013-07-21 DIAGNOSIS — E785 Hyperlipidemia, unspecified: Secondary | ICD-10-CM | POA: Insufficient documentation

## 2013-07-21 DIAGNOSIS — Z8673 Personal history of transient ischemic attack (TIA), and cerebral infarction without residual deficits: Secondary | ICD-10-CM | POA: Insufficient documentation

## 2013-07-21 DIAGNOSIS — Z7982 Long term (current) use of aspirin: Secondary | ICD-10-CM | POA: Insufficient documentation

## 2013-07-21 DIAGNOSIS — J449 Chronic obstructive pulmonary disease, unspecified: Secondary | ICD-10-CM | POA: Insufficient documentation

## 2013-07-21 DIAGNOSIS — Z7902 Long term (current) use of antithrombotics/antiplatelets: Secondary | ICD-10-CM | POA: Insufficient documentation

## 2013-07-21 DIAGNOSIS — F17200 Nicotine dependence, unspecified, uncomplicated: Secondary | ICD-10-CM | POA: Insufficient documentation

## 2013-07-21 DIAGNOSIS — I10 Essential (primary) hypertension: Secondary | ICD-10-CM | POA: Insufficient documentation

## 2013-07-21 DIAGNOSIS — Z79899 Other long term (current) drug therapy: Secondary | ICD-10-CM | POA: Insufficient documentation

## 2013-07-21 DIAGNOSIS — Z7983 Long term (current) use of bisphosphonates: Secondary | ICD-10-CM | POA: Insufficient documentation

## 2013-07-21 DIAGNOSIS — J4489 Other specified chronic obstructive pulmonary disease: Secondary | ICD-10-CM | POA: Insufficient documentation

## 2013-07-21 DIAGNOSIS — R0789 Other chest pain: Secondary | ICD-10-CM

## 2013-07-21 DIAGNOSIS — D649 Anemia, unspecified: Secondary | ICD-10-CM | POA: Insufficient documentation

## 2013-07-21 MED ORDER — HYDROCODONE-ACETAMINOPHEN 5-325 MG PO TABS
2.0000 | ORAL_TABLET | Freq: Once | ORAL | Status: AC
  Administered 2013-07-21: 2 via ORAL
  Filled 2013-07-21: qty 2

## 2013-07-21 NOTE — ED Notes (Signed)
Bent over this evening, went to stand back up, heard/felt a pop.  C/o left sided chest pain.

## 2013-07-21 NOTE — Discharge Instructions (Signed)
Chest Wall Pain Chest wall pain is pain felt in or around the chest bones and muscles. It may take up to 6 weeks to get better. It may take longer if you are active. Chest wall pain can happen on its own. Other times, things like germs, injury, coughing, or exercise can cause the pain. HOME CARE   Avoid activities that make you tired or cause pain. Try not to use your chest, belly (abdominal), or side muscles. Do not use heavy weights.  Put ice on the sore area.  Put ice in a plastic bag.  Place a towel between your skin and the bag.  Leave the ice on for 15-20 minutes for the first 2 days.  Only take medicine as told by your doctor. GET HELP RIGHT AWAY IF:   You have more pain or are very uncomfortable.  You have a fever.  Your chest pain gets worse.  You have new problems.  You feel sick to your stomach (nauseous) or throw up (vomit).  You start to sweat or feel lightheaded.  You have a cough with mucus (phlegm).  You cough up blood. MAKE SURE YOU:   Understand these instructions.  Will watch your condition.  Will get help right away if you are not doing well or get worse. Document Released: 08/25/2007 Document Revised: 05/31/2011 Document Reviewed: 11/02/2010 [facility] Patient Information 2014 [place], [place].

## 2013-07-21 NOTE — ED Provider Notes (Signed)
CSN: [id number]     Arrival date & time 07/21/13  2120 History  This chart was scribed for Anne Lanes, MD by Elby Beck, ED Scribe. This patient was seen in room MH08/MH08 and the patient's care was started at 10:12 PM.   Chief Complaint  Patient presents with  . Chest Pain    The history is provided by the patient. No language interpreter was used.    HPI Comments: Anne Morrow is a 68 y.o. female with a history of COPD who presents to the Emergency Department complaining of constant, severe left lower chest pain, over her rib area, onset earlier tonight. She states that the pain onset suddenly when she was straightening her back from a bent over position. She states that she heard a "pop" in her rib area at this time. She states that she has a prior history of a fractured rib to the same area from forceful coughing. She believes that she fractured a rib tonight. Her pain is worsened with palpation. She states that she has not tried any medications for her pain. She denies any other pain or symptoms.    Past Medical History  Diagnosis Date  . Hypertension   . COPD (chronic obstructive pulmonary disease)   . Hyperlipemia   . Carotid artery occlusion   . Anemia   . Stroke    Past Surgical History  Procedure Laterality Date  . Back surgery    . Joint replacement    . Abdominal hysterectomy    . Tubal ligation    . Hemorrhoid surgery  1985  . Cataract extraction w/ intraocular lens  implant, bilateral Bilateral     R 1998, L 2000  . Shoulder arthroscopy Left    No family history on file. History  Substance Use Topics  . Smoking status: Current Every Day Smoker -- 1.00 packs/day for 60 years    Types: Cigarettes  . Smokeless tobacco: Not on file     Comment: smoking cessation info given and reviewed  . Alcohol Use: No   OB History   Grav Para Term Preterm Abortions TAB SAB Ect Mult Living                 Review of Systems  Cardiovascular: Positive for chest  pain.  All other systems reviewed and are negative.   Allergies  Fexofenadine; Loratadine; Morphine and related; and Pseudoephedrine  Home Medications   Prior to Admission medications   Medication Sig Start Date End Date Taking? Authorizing Provider  albuterol (PROVENTIL) 90 MCG/ACT inhaler Inhale 2 puffs into the lungs every 6 (six) hours as needed.      Historical Provider, MD  alendronate (FOSAMAX) 70 MG tablet Take 70 mg by mouth every 7 (seven) days. Take with a full glass of water on an empty stomach.     Historical Provider, MD  ALPRAZolam Duanne Moron) 0.5 MG tablet Take 0.5 mg by mouth at bedtime as needed.      Historical Provider, MD  aspirin 81 MG tablet Take 81 mg by mouth daily.    Historical Provider, MD  budesonide-formoterol (SYMBICORT) 80-4.5 MCG/ACT inhaler Inhale 2 puffs into the lungs 2 (two) times daily.      Historical Provider, MD  calcium-vitamin D (OSCAL) 250-125 MG-UNIT per tablet Take 1 tablet by mouth daily.      Historical Provider, MD  Cholecalciferol (VITAMIN D3) 1000 UNITS CAPS Take 1 capsule by mouth daily.    Historical Provider, MD  clopidogrel (PLAVIX)  75 MG tablet Take 1 tablet (75 mg total) by mouth daily. 02/02/13   Conrad [place], MD  Cyanocobalamin (VITAMIN B-12 PO) Take by mouth as needed.    Historical Provider, MD  ferrous sulfate 325 (65 FE) MG EC tablet Take 325 mg by mouth Nightly.     Historical Provider, MD  folic acid (FOLVITE) 1 MG tablet Take 1 mg by mouth daily.    Historical Provider, MD  HYDROcodone-acetaminophen (VICODIN) 5-500 MG per tablet Take 1 tablet by mouth every 6 (six) hours as needed.      Historical Provider, MD  ipratropium (ATROVENT) 0.02 % nebulizer solution Inhale 2 mg into the lungs 4 (four) times daily.      Historical Provider, MD  levothyroxine (SYNTHROID, LEVOTHROID) 25 MCG tablet Take 25 mcg by mouth daily.      Historical Provider, MD  mometasone (NASONEX) 50 MCG/ACT nasal spray Place 2 sprays into the nose daily.       Historical Provider, MD  OXYGEN-HELIUM IN Inhale 2 L/hr into the lungs daily.    Historical Provider, MD  pantoprazole (PROTONIX) 40 MG tablet Take 40 mg by mouth daily.    Historical Provider, MD  ramipril (ALTACE) 2.5 MG tablet Take 2.5 mg by mouth daily.      Historical Provider, MD  rosuvastatin (CRESTOR) 20 MG tablet Take 20 mg by mouth daily.      Historical Provider, MD   Triage Vitals: BP 112/55  Pulse 82  Temp(Src) 98 F (36.7 C) (Oral)  Resp 20  Ht 5\' 2"  (1.575 m)  Wt 99 lb (44.906 kg)  BMI 18.10 kg/m2  SpO2 100%  Physical Exam  Nursing note and vitals reviewed. Constitutional: She is oriented to person, place, and time. She appears well-developed and well-nourished. No distress.  HENT:  Head: Normocephalic and atraumatic.  Eyes: Pupils are equal, round, and reactive to light.  Neck: Normal range of motion.  Cardiovascular: Normal rate and intact distal pulses.   Pulmonary/Chest: No respiratory distress.    Point chest wall tenderness to palpation  Abdominal: Normal appearance. She exhibits no distension.  Musculoskeletal: Normal range of motion.  Neurological: She is alert and oriented to person, place, and time. No cranial nerve deficit.  Skin: Skin is warm and dry. No rash noted.  Psychiatric: She has a normal mood and affect. Her behavior is normal.    ED Course  Procedures (including critical care time)  DIAGNOSTIC STUDIES: Oxygen Saturation is 100% on RA, normal by my interpretation.    COORDINATION OF CARE: 10:18 PM- Discussed clinical suspicion that pt fractured a rib. Discussed plan to obtain diagnostic radiology and to order pain medication. Pt advised of plan for treatment and pt agrees.  Labs Review Labs Reviewed - No data to display  Imaging Review    DG Ribs Unilateral W/Chest Left (Edited Result - FINAL)  Result time: 07/21/13 22:58:49    Addendum 1 of 1 by Rad Results In Interface (07/21/13 22:58:49)   ADDENDUM REPORT: 07/21/2013 22:58   ADDENDUM:  Dictation error in the impression of the original report. The final  line should read:  NO displaced left fracture is seen.  The patient has old displaced right rib fractures but no acute  abnormality. Remainder of the report, including the findings, is  unchanged.  Electronically Signed  By: Julian Hy M.D.  On: 07/21/2013 22:58     MDM   Final diagnoses:  Acute chest wall pain      I personally  performed the services described in this documentation, which was scribed in my presence. The recorded information has been reviewed and considered.   Anne Lanes, MD 08/01/13 1336

## 2013-07-23 ENCOUNTER — Emergency Department (HOSPITAL_BASED_OUTPATIENT_CLINIC_OR_DEPARTMENT_OTHER)
Admission: EM | Admit: 2013-07-23 | Discharge: 2013-07-23 | Disposition: A | Discharge status: Home / Self Care | Payer: Medicare Other | Attending: Emergency Medicine | Admitting: Emergency Medicine

## 2013-07-23 ENCOUNTER — Encounter (HOSPITAL_BASED_OUTPATIENT_CLINIC_OR_DEPARTMENT_OTHER): Payer: Self-pay | Admitting: Emergency Medicine

## 2013-07-23 ENCOUNTER — Emergency Department (HOSPITAL_BASED_OUTPATIENT_CLINIC_OR_DEPARTMENT_OTHER): Payer: Medicare Other

## 2013-07-23 DIAGNOSIS — Y9389 Activity, other specified: Secondary | ICD-10-CM | POA: Insufficient documentation

## 2013-07-23 DIAGNOSIS — IMO0002 Reserved for concepts with insufficient information to code with codable children: Secondary | ICD-10-CM | POA: Insufficient documentation

## 2013-07-23 DIAGNOSIS — D649 Anemia, unspecified: Secondary | ICD-10-CM | POA: Insufficient documentation

## 2013-07-23 DIAGNOSIS — Z7982 Long term (current) use of aspirin: Secondary | ICD-10-CM | POA: Insufficient documentation

## 2013-07-23 DIAGNOSIS — Z8781 Personal history of (healed) traumatic fracture: Secondary | ICD-10-CM | POA: Insufficient documentation

## 2013-07-23 DIAGNOSIS — R0781 Pleurodynia: Secondary | ICD-10-CM

## 2013-07-23 DIAGNOSIS — F17200 Nicotine dependence, unspecified, uncomplicated: Secondary | ICD-10-CM | POA: Insufficient documentation

## 2013-07-23 DIAGNOSIS — J441 Chronic obstructive pulmonary disease with (acute) exacerbation: Secondary | ICD-10-CM | POA: Insufficient documentation

## 2013-07-23 DIAGNOSIS — Z8673 Personal history of transient ischemic attack (TIA), and cerebral infarction without residual deficits: Secondary | ICD-10-CM | POA: Insufficient documentation

## 2013-07-23 DIAGNOSIS — S298XXA Other specified injuries of thorax, initial encounter: Secondary | ICD-10-CM | POA: Insufficient documentation

## 2013-07-23 DIAGNOSIS — Z7902 Long term (current) use of antithrombotics/antiplatelets: Secondary | ICD-10-CM | POA: Insufficient documentation

## 2013-07-23 DIAGNOSIS — Y929 Unspecified place or not applicable: Secondary | ICD-10-CM | POA: Insufficient documentation

## 2013-07-23 DIAGNOSIS — J449 Chronic obstructive pulmonary disease, unspecified: Secondary | ICD-10-CM

## 2013-07-23 DIAGNOSIS — X500XXA Overexertion from strenuous movement or load, initial encounter: Secondary | ICD-10-CM | POA: Insufficient documentation

## 2013-07-23 DIAGNOSIS — Z9981 Dependence on supplemental oxygen: Secondary | ICD-10-CM | POA: Insufficient documentation

## 2013-07-23 DIAGNOSIS — E785 Hyperlipidemia, unspecified: Secondary | ICD-10-CM | POA: Insufficient documentation

## 2013-07-23 DIAGNOSIS — I10 Essential (primary) hypertension: Secondary | ICD-10-CM | POA: Insufficient documentation

## 2013-07-23 DIAGNOSIS — Z79899 Other long term (current) drug therapy: Secondary | ICD-10-CM | POA: Insufficient documentation

## 2013-07-23 LAB — CBC WITH DIFFERENTIAL/PLATELET
BASOS ABS: 0 10*3/uL (ref 0.0–0.1)
Basophils Relative: 0 % (ref 0–1)
EOS PCT: 2 % (ref 0–5)
Eosinophils Absolute: 0.2 10*3/uL (ref 0.0–0.7)
HCT: 40.2 % (ref 36.0–46.0)
Hemoglobin: 13 g/dL (ref 12.0–15.0)
LYMPHS ABS: 1.6 10*3/uL (ref 0.7–4.0)
Lymphocytes Relative: 14 % (ref 12–46)
MCH: 32.7 pg (ref 26.0–34.0)
MCHC: 32.3 g/dL (ref 30.0–36.0)
MCV: 101.3 fL — AB (ref 78.0–100.0)
Monocytes Absolute: 0.8 10*3/uL (ref 0.1–1.0)
Monocytes Relative: 7 % (ref 3–12)
Neutro Abs: 8.7 10*3/uL — ABNORMAL HIGH (ref 1.7–7.7)
Neutrophils Relative %: 77 % (ref 43–77)
PLATELETS: 265 10*3/uL (ref 150–400)
RBC: 3.97 MIL/uL (ref 3.87–5.11)
RDW: 12.9 % (ref 11.5–15.5)
WBC: 11.3 10*3/uL — ABNORMAL HIGH (ref 4.0–10.5)

## 2013-07-23 LAB — BASIC METABOLIC PANEL
BUN: 12 mg/dL (ref 6–23)
CALCIUM: 10.2 mg/dL (ref 8.4–10.5)
CO2: 27 meq/L (ref 19–32)
Chloride: 97 mEq/L (ref 96–112)
Creatinine, Ser: 0.8 mg/dL (ref 0.50–1.10)
GFR calc Af Amer: 86 mL/min — ABNORMAL LOW (ref >90)
GFR calc non Af Amer: 74 mL/min — ABNORMAL LOW (ref >90)
GLUCOSE: 130 mg/dL — AB (ref 70–99)
Potassium: 4 mEq/L (ref 3.7–5.3)
Sodium: 140 mEq/L (ref 137–147)

## 2013-07-23 MED ORDER — PREDNISONE 50 MG PO TABS
60.0000 mg | ORAL_TABLET | Freq: Once | ORAL | Status: DC
  Filled 2013-07-23 (×2): qty 1

## 2013-07-23 MED ORDER — PREDNISONE 10 MG PO TABS: 40.0000 mg | ORAL_TABLET | Freq: Every day | ORAL | Status: DC

## 2013-07-23 MED ORDER — PREDNISONE 50 MG PO TABS
60.0000 mg | ORAL_TABLET | Freq: Once | ORAL | Status: AC
  Administered 2013-07-23: 60 mg via ORAL

## 2013-07-23 NOTE — ED Provider Notes (Signed)
CSN: [id number]     Arrival date & time 07/23/13  1840 History  This chart was scribed for Mervin Kung, MD by Elby Beck, ED Scribe. This patient was seen in room MH10/MH10 and the patient's care was started at 7:45 PM.   Chief Complaint  Patient presents with  . Rib Injury    Patient is a 68 y.o. female presenting with chest pain. The history is provided by the patient. No language interpreter was used.  Chest Pain Pain location:  L lateral chest (lower rib area) Pain severity:  Severe ("10/10") Onset quality:  Sudden Duration:  3 days Timing:  Constant Progression:  Waxing and waning Chronicity:  New Relieved by: prescribed Hydrocodone. Worsened by:  Coughing and movement Ineffective treatments:  None tried Associated symptoms: back pain, cough and shortness of breath   Associated symptoms: no fever, no headache, no nausea and not vomiting   Risk factors: smoking     HPI Comments: Anne Morrow is a 68 y.o. female with a history of COPD who presents to the Emergency Department complaining of constant, severe ("10/10") left lateral lower rib pain onset 2 days ago. She states that the pain onset suddenly when she was straightening her back from a bent over position. She states that she heard a "pop" in her rib area at this time. She was seen here 2 days ago for the same and had radiology showing old rib fractures. She states that she has a prior history of a fractured rib to the same area of her pain tonight from forceful coughing. She reports associated worsening wheezing and SOB since last seen here 2 days ago. She also reports a cough, productive of thick, clear phlegm. She is on 2L of oxygen via a nasal cannula at home. She also uses in haler at home. She has used Prednisone in the past, but none recently. She has been doubling up on her prescribed Hydrocodone for this pain over the past 2 days, which has been offering moderate relief of her pain.   PCP- Dr. Antony Blackbird   Past Medical History  Diagnosis Date  . Hypertension   . COPD (chronic obstructive pulmonary disease)   . Hyperlipemia   . Carotid artery occlusion   . Anemia   . Stroke    Past Surgical History  Procedure Laterality Date  . Back surgery    . Joint replacement    . Abdominal hysterectomy    . Tubal ligation    . Hemorrhoid surgery  1985  . Cataract extraction w/ intraocular lens  implant, bilateral Bilateral     R 1998, L 2000  . Shoulder arthroscopy Left    No family history on file. History  Substance Use Topics  . Smoking status: Current Every Day Smoker -- 1.00 packs/day for 60 years    Types: Cigarettes  . Smokeless tobacco: Not on file     Comment: smoking cessation info given and reviewed  . Alcohol Use: No   OB History   Grav Para Term Preterm Abortions TAB SAB Ect Mult Living                 Review of Systems  Constitutional: Negative for fever and chills.  HENT: Positive for rhinorrhea. Negative for sore throat.   Eyes: Negative for visual disturbance.  Respiratory: Positive for cough and shortness of breath.   Cardiovascular: Positive for chest pain. Negative for leg swelling.  Gastrointestinal: Negative for nausea, vomiting and diarrhea.  Genitourinary: Negative for dysuria.  Musculoskeletal: Positive for back pain.  Skin: Negative for rash.  Neurological: Negative for headaches.  Hematological: Does not bruise/bleed easily.  Psychiatric/Behavioral: Negative for confusion.    Allergies  Fexofenadine; Loratadine; Morphine and related; and Pseudoephedrine  Home Medications   Prior to Admission medications   Medication Sig Start Date End Date Taking? Authorizing Provider  albuterol (PROVENTIL) 90 MCG/ACT inhaler Inhale 2 puffs into the lungs every 6 (six) hours as needed.      Historical Provider, MD  alendronate (FOSAMAX) 70 MG tablet Take 70 mg by mouth every 7 (seven) days. Take with a full glass of water on an empty stomach.      Historical Provider, MD  ALPRAZolam Duanne Moron) 0.5 MG tablet Take 0.5 mg by mouth at bedtime as needed.      Historical Provider, MD  aspirin 81 MG tablet Take 81 mg by mouth daily.    Historical Provider, MD  budesonide-formoterol (SYMBICORT) 80-4.5 MCG/ACT inhaler Inhale 2 puffs into the lungs 2 (two) times daily.      Historical Provider, MD  calcium-vitamin D (OSCAL) 250-125 MG-UNIT per tablet Take 1 tablet by mouth daily.      Historical Provider, MD  Cholecalciferol (VITAMIN D3) 1000 UNITS CAPS Take 1 capsule by mouth daily.    Historical Provider, MD  clopidogrel (PLAVIX) 75 MG tablet Take 1 tablet (75 mg total) by mouth daily. 02/02/13   Conrad [place], MD  Cyanocobalamin (VITAMIN B-12 PO) Take by mouth as needed.    Historical Provider, MD  ferrous sulfate 325 (65 FE) MG EC tablet Take 325 mg by mouth Nightly.     Historical Provider, MD  folic acid (FOLVITE) 1 MG tablet Take 1 mg by mouth daily.    Historical Provider, MD  HYDROcodone-acetaminophen (VICODIN) 5-500 MG per tablet Take 1 tablet by mouth every 6 (six) hours as needed.      Historical Provider, MD  ipratropium (ATROVENT) 0.02 % nebulizer solution Inhale 2 mg into the lungs 4 (four) times daily.      Historical Provider, MD  levothyroxine (SYNTHROID, LEVOTHROID) 25 MCG tablet Take 25 mcg by mouth daily.      Historical Provider, MD  mometasone (NASONEX) 50 MCG/ACT nasal spray Place 2 sprays into the nose daily.      Historical Provider, MD  OXYGEN-HELIUM IN Inhale 2 L/hr into the lungs daily.    Historical Provider, MD  pantoprazole (PROTONIX) 40 MG tablet Take 40 mg by mouth daily.    Historical Provider, MD  ramipril (ALTACE) 2.5 MG tablet Take 2.5 mg by mouth daily.      Historical Provider, MD  rosuvastatin (CRESTOR) 20 MG tablet Take 20 mg by mouth daily.      Historical Provider, MD   Triage Vitals: BP 103/73  Pulse 94  Temp(Src) 98.7 F (37.1 C) (Oral)  Resp 22  Ht 5\' 2"  (1.575 m)  Wt 100 lb (45.36 kg)  BMI 18.29  kg/m2  SpO2 96%  Physical Exam  Nursing note and vitals reviewed. Constitutional: She is oriented to person, place, and time. She appears well-developed and well-nourished. No distress.  HENT:  Head: Normocephalic and atraumatic.  Eyes: EOM are normal.  Neck: Neck supple. No tracheal deviation present.  Cardiovascular: Normal rate and regular rhythm.   Pulmonary/Chest: Effort normal. No respiratory distress. She has no wheezes.  Decreased breath sounds bilaterally. No crepitance around the left ribs.  Abdominal: Bowel sounds are normal. There is no tenderness.  Musculoskeletal: Normal range of motion.  No swelling in ankles  Neurological: She is alert and oriented to person, place, and time.  Skin: Skin is warm and dry.  Psychiatric: She has a normal mood and affect. Her behavior is normal.    ED Course  Procedures (including critical care time)  DIAGNOSTIC STUDIES: Oxygen Saturation is 96% on RA, normal by my interpretation.    COORDINATION OF CARE: 7:55 PM- Will order a CXR, CBC and BMP. Pt advised of plan for treatment and pt agrees.  Labs Review Labs Reviewed  CBC WITH DIFFERENTIAL - Abnormal; Notable for the following:    WBC 11.3 (*)    MCV 101.3 (*)    Neutro Abs 8.7 (*)    All other components within normal limits  BASIC METABOLIC PANEL - Abnormal; Notable for the following:    Glucose, Bld 130 (*)    GFR calc non Af Amer 74 (*)    GFR calc Af Amer 86 (*)    All other components within normal limits   Results for orders placed during the hospital encounter of 07/23/13  CBC WITH DIFFERENTIAL      Result Value Ref Range   WBC 11.3 (*) 4.0 - 10.5 K/uL   RBC 3.97  3.87 - 5.11 MIL/uL   Hemoglobin 13.0  12.0 - 15.0 g/dL   HCT 40.2  36.0 - 46.0 %   MCV 101.3 (*) 78.0 - 100.0 fL   MCH 32.7  26.0 - 34.0 pg   MCHC 32.3  30.0 - 36.0 g/dL   RDW 12.9  11.5 - 15.5 %   Platelets 265  150 - 400 K/uL   Neutrophils Relative % 77  43 - 77 %   Neutro Abs 8.7 (*) 1.7 - 7.7  K/uL   Lymphocytes Relative 14  12 - 46 %   Lymphs Abs 1.6  0.7 - 4.0 K/uL   Monocytes Relative 7  3 - 12 %   Monocytes Absolute 0.8  0.1 - 1.0 K/uL   Eosinophils Relative 2  0 - 5 %   Eosinophils Absolute 0.2  0.0 - 0.7 K/uL   Basophils Relative 0  0 - 1 %   Basophils Absolute 0.0  0.0 - 0.1 K/uL  BASIC METABOLIC PANEL      Result Value Ref Range   Sodium 140  137 - 147 mEq/L   Potassium 4.0  3.7 - 5.3 mEq/L   Chloride 97  96 - 112 mEq/L   CO2 27  19 - 32 mEq/L   Glucose, Bld 130 (*) 70 - 99 mg/dL   BUN 12  6 - 23 mg/dL   Creatinine, Ser 0.80  0.50 - 1.10 mg/dL   Calcium 10.2  8.4 - 10.5 mg/dL   GFR calc non Af Amer 74 (*) >90 mL/min   GFR calc Af Amer 86 (*) >90 mL/min     Imaging Review Dg Chest 2 View  07/23/2013   CLINICAL DATA:  Rib injury  EXAM: CHEST  2 VIEW  COMPARISON:  Chest/ bilateral rib radiographs dated 07/21/2013  FINDINGS: Chronic interstitial markings/emphysematous changes with hyperinflation. No focal consolidation. No pleural effusion or pneumothorax.  The heart is normal in size.  Multiple chronic, healed bilateral rib fractures deformities.  IMPRESSION: No evidence of acute cardiopulmonary disease.   Electronically Signed   By: Julian Hy M.D.   On: 07/23/2013 21:08   Dg Ribs Unilateral W/chest Left  07/21/2013   ADDENDUM REPORT: 07/21/2013 22:58  ADDENDUM:  Dictation error in the impression of the original report. The final line should read:  NO displaced left fracture is seen.  The patient has old displaced right rib fractures but no acute abnormality. Remainder of the report, including the findings, is unchanged.   Electronically Signed   By: Julian Hy M.D.   On: 07/21/2013 22:58   07/21/2013   CLINICAL DATA:  Left-sided chest pain  EXAM: LEFT RIBS AND CHEST - 3+ VIEW  COMPARISON:  Chest radiographs dated 12/30/2012  FINDINGS: Stable right upper lobe scarring. Stable left upper lobe opacity/scarring. Emphysematous changes with hyperinflation. No  pleural effusion or pneumothorax.  The heart is normal in size.  Multiple chronic left rib fracture deformities.  No displaced left rib fracture is seen.  IMPRESSION: Emphysematous changes with bilateral upper lobe opacities/scarring, chronic.  Multiple chronic left rib fracture deformities.  Displaced left rib fracture is seen.  Electronically Signed: By: Julian Hy M.D. On: 07/21/2013 22:40     EKG Interpretation None      MDM   Final diagnoses:  COPD (chronic obstructive pulmonary disease)  Rib pain    No wheezing today. Oxygen saturations on her normal 2 L are 96%. Chest x-ray negative for pneumonia or pneumothorax. Patient is taking pain medication at home. Does have chest wall pain due to the ribs on the left side. The patient will probably benefit from a trial of prednisone has not been on prednisone she is using her albuterol inhaler. However followup with her regular doctor. Facial return for any new or worse symptoms. No acute distress and nontoxic currently.   I personally performed the services described in this documentation, which was scribed in my presence. The recorded information has been reviewed and is accurate.     Mervin Kung, MD 07/23/13 2125

## 2013-07-23 NOTE — Discharge Instructions (Signed)
Chest x-ray negative for pneumonia or pneumothorax. Give your child prednisone to try to cut down the coughing. Continue to take your pain medicine. Continue you should be a role inhaler. Return for any new or worse symptoms. Make an appointment to followup with your doctor.

## 2013-07-23 NOTE — ED Notes (Signed)
Pt with left lateral lower rib pain since cleaning a floor several days ago.  Heard a 'pop' when getting back up.  Seen here 2 days ago for same.

## 2013-08-14 ENCOUNTER — Institutional Professional Consult (permissible substitution): Payer: Medicare Other | Admitting: Pulmonary Disease

## 2013-08-23 ENCOUNTER — Encounter: Payer: Self-pay | Admitting: Family

## 2013-08-24 ENCOUNTER — Encounter: Payer: Self-pay | Admitting: Family

## 2013-08-24 ENCOUNTER — Ambulatory Visit (INDEPENDENT_AMBULATORY_CARE_PROVIDER_SITE_OTHER): Payer: Medicare Other | Admitting: Family

## 2013-08-24 ENCOUNTER — Ambulatory Visit (HOSPITAL_COMMUNITY)
Admission: RE | Admit: 2013-08-24 | Discharge: 2013-08-24 | Disposition: A | Discharge status: Home / Self Care | Payer: Medicare Other | Source: Ambulatory Visit | Attending: Family | Admitting: Family

## 2013-08-24 VITALS — BP 113/55 | HR 70 | Resp 16 | Ht 62.0 in | Wt 98.5 lb

## 2013-08-24 DIAGNOSIS — I658 Occlusion and stenosis of other precerebral arteries: Secondary | ICD-10-CM | POA: Insufficient documentation

## 2013-08-24 DIAGNOSIS — I6529 Occlusion and stenosis of unspecified carotid artery: Secondary | ICD-10-CM | POA: Insufficient documentation

## 2013-08-24 NOTE — Progress Notes (Signed)
Established Carotid Patient   History of Present Illness  Anne Morrow is a 68 y.o. female patient of Dr. Bridgett Larsson who has known carotid stenosis. She returns today for routine surveillance. She has COPD and is using an oxygen concentrator at 2L/[place], with her now.  At her December, 2014 visit Dr. Bridgett Larsson reviewed her CTA findings as the following: Based on my review of CTA, the patient hs extensive aortic arch atherosclerosis with proximal disease without significant hemodynamic stenoses. She has extensive calcification of both carotid bifurcation (R>>L). Neither ICA appears to have stenosis >80%. The L ICA disease appears less extensive than the R ICA. I don't seen any soft plaque within either bifurcation.  Her last stroke was in 2002, had 2 previous strokes, as manifested by right hand numbness and the next night left leg numbness and weakness, had a limp for about a year, has no residual neurological deficit.  Patient has not had previous carotid artery intervention. She denies claudication symptoms in legs with walking, denies non healing wounds.  Pt Diabetic: No Pt smoker: smoker  (1 ppd x 60 yrs)  Pt meds include: Statin : Yes ASA: Yes Other anticoagulants/antiplatelets: Plavix   Past Medical History  Diagnosis Date  . Hypertension   . COPD (chronic obstructive pulmonary disease)   . Hyperlipemia   . Carotid artery occlusion   . Anemia   . Stroke     Social History History  Substance Use Topics  . Smoking status: Current Every Day Smoker -- 1.00 packs/day for 60 years    Types: Cigarettes  . Smokeless tobacco: Never Used     Comment: smoking cessation info given and reviewed  . Alcohol Use: No    Family History No family history on file.  Surgical History Past Surgical History  Procedure Laterality Date  . Back surgery    . Joint replacement    . Abdominal hysterectomy    . Tubal ligation    . Hemorrhoid surgery  1985  . Cataract extraction w/ intraocular  lens  implant, bilateral Bilateral     R 1998, L 2000  . Shoulder arthroscopy Left     Allergies  Allergen Reactions  . Fexofenadine   . Loratadine   . Morphine And Related   . Pseudoephedrine     Current Outpatient Prescriptions  Medication Sig Dispense Refill  . albuterol (PROVENTIL) 90 MCG/ACT inhaler Inhale 2 puffs into the lungs every 6 (six) hours as needed.        Marland Kitchen alendronate (FOSAMAX) 70 MG tablet Take 70 mg by mouth every 7 (seven) days. Take with a full glass of water on an empty stomach.       . ALPRAZolam (XANAX) 0.5 MG tablet Take 0.5 mg by mouth at bedtime as needed.        Marland Kitchen aspirin 81 MG tablet Take 81 mg by mouth daily.      . budesonide-formoterol (SYMBICORT) 80-4.5 MCG/ACT inhaler Inhale 2 puffs into the lungs 2 (two) times daily.        . calcium-vitamin D (OSCAL) 250-125 MG-UNIT per tablet Take 1 tablet by mouth daily.        . Cholecalciferol (VITAMIN D3) 1000 UNITS CAPS Take 1 capsule by mouth daily.      . clopidogrel (PLAVIX) 75 MG tablet Take 1 tablet (75 mg total) by mouth daily.  30 tablet  11  . Cyanocobalamin (VITAMIN B-12 PO) Take by mouth as needed.      . ferrous sulfate  325 (65 FE) MG EC tablet Take 325 mg by mouth Nightly.       . folic acid (FOLVITE) 1 MG tablet Take 1 mg by mouth daily.      Marland Kitchen HYDROcodone-acetaminophen (VICODIN) 5-500 MG per tablet Take 1 tablet by mouth every 6 (six) hours as needed.        Marland Kitchen ipratropium (ATROVENT) 0.02 % nebulizer solution Inhale 2 mg into the lungs 4 (four) times daily.        Marland Kitchen levothyroxine (SYNTHROID, LEVOTHROID) 25 MCG tablet Take 25 mcg by mouth daily.        . mometasone (NASONEX) 50 MCG/ACT nasal spray Place 2 sprays into the nose daily.        . OXYGEN-HELIUM IN Inhale 2 L/hr into the lungs daily.      . pantoprazole (PROTONIX) 40 MG tablet Take 40 mg by mouth daily.      . predniSONE (DELTASONE) 10 MG tablet Take 4 tablets (40 mg total) by mouth daily.  20 tablet  0  . rosuvastatin (CRESTOR) 20 MG  tablet Take 20 mg by mouth daily.        . ramipril (ALTACE) 2.5 MG tablet Take 2.5 mg by mouth daily.         No current facility-administered medications for this visit.    Review of Systems : See HPI for pertinent positives and negatives.  Physical Examination   Filed Vitals:   08/24/13 1135 08/24/13 1137  BP: 112/64 113/55  Pulse: 68 70  Resp: 16   Height: 5\' 2"  (1.575 m)   Weight: 98 lb 8 oz (44.679 kg)    Body mass index is 18.01 kg/(m^2).   General: WDWN female in NAD GAIT: slow and deliberate Eyes: PERRLA Pulmonary:  Non-labored, CTAB but with decreased air movement in all fields, Negative  Rales, Negative rhonchi, & Negative wheezing, has an oxygen concentrator at 2L/[place]. Cardiac: regular Rhythm ,  Negative detected murmur.  VASCULAR EXAM Carotid Bruits Left Right   Negative Negative     Radial pulses are 1+ palpable and equal.                                                                                                                            LE Pulses LEFT RIGHT       POPLITEAL  Not palpable   Not palpable       POSTERIOR TIBIAL  1+ palpable   1+ palpable        DORSALIS PEDIS      ANTERIOR TIBIAL 2+ palpable  2+ palpable     Gastrointestinal: soft, nontender, BS WNL, no r/g,  negative masses.  Musculoskeletal: Positive mild generalized muscle atrophy/wasting. M/S 3/5 throughout, Extremities without ischemic changes.  Neurologic: A&O X 3; Appropriate Affect ; SENSATION ;normal;  Speech is normal CN 2-12 intact, Pain and light touch intact in extremities, Motor exam as listed above.   Non-Invasive Vascular Imaging CAROTID DUPLEX 08/24/2013  CEREBROVASCULAR DUPLEX EVALUATION    INDICATION: Carotid disease    PREVIOUS INTERVENTION(S):     DUPLEX EXAM:     RIGHT  LEFT  Peak Systolic Velocities (cm/s) End Diastolic Velocities (cm/s) Plaque LOCATION Peak Systolic Velocities (cm/s) End Diastolic Velocities (cm/s) Plaque  64 18  CCA PROXIMAL  65 19   58 21 CP CCA MID 60 20   57 23 CP CCA DISTAL 74 18 CP  74 11 HT ECA 83 18 HT  82 31 CP ICA PROXIMAL 95 31 CP  104 38  ICA MID 83 32   67 24  ICA DISTAL 75 28     1.4 ICA / CCA Ratio (PSV) 1.3  Antegrade Vertebral Flow Antegrade  503 Brachial Systolic Pressure (mmHg) 546  Multiphasic (subclavian artery) Brachial Artery Waveforms Multiphasic (subclavian artery)    Plaque Morphology:  HM = Homogeneous, HT = Heterogeneous, CP = Calcific Plaque, SP = Smooth Plaque, IP = Irregular Plaque     ADDITIONAL FINDINGS: No significant stenosis of the bilateral external or common carotid arteries.    IMPRESSION: Doppler velocities suggest less than 40% stenoses of the bilateral proximal internal carotid arteries.    Compared to the previous exam:  No previous duplex exam at this facility available for comparison.        Assessment: Anne Morrow is a 68 y.o. female who presents with asymptomatic minimal bilateral ICA stenosis. No previous duplex exam at this facility available for comparison.   Plan:  Patient was counseled re smoking cessation. Follow-up in 1 year with Carotid Duplex scan.   I discussed in depth with the patient the nature of atherosclerosis, and emphasized the importance of maximal medical management including strict control of blood pressure, blood glucose, and lipid levels, obtaining regular exercise, and cessation of smoking.  The patient is aware that without maximal medical management the underlying atherosclerotic disease process will progress, limiting the benefit of any interventions. The patient was given information about stroke prevention and what symptoms should prompt the patient to seek immediate medical care. Thank you for allowing [place] to participate in this patient's care.  Clemon Chambers, RN, MSN, FNP-C [facility] Office: [phone]  Clinic Physician: Bridgett Larsson  08/24/2013 12:10 PM

## 2013-08-24 NOTE — Patient Instructions (Addendum)
Stroke Prevention Some medical conditions and behaviors are associated with an increased chance of having a stroke. You may prevent a stroke by making healthy choices and managing medical conditions. HOW CAN I REDUCE MY RISK OF HAVING A STROKE?   Stay physically active. Get at least 30 minutes of activity on most or all days.  Do not smoke. It may also be helpful to avoid exposure to secondhand smoke.  Limit alcohol use. Moderate alcohol use is considered to be:  No more than 2 drinks per day for men.  No more than 1 drink per day for nonpregnant women.  Eat healthy foods. This involves  Eating 5 or more servings of fruits and vegetables a day.  Following a diet that addresses high blood pressure (hypertension), high cholesterol, diabetes, or obesity.  Manage your cholesterol levels.  A diet low in saturated fat, trans fat, and cholesterol and high in fiber may control cholesterol levels.  Take any prescribed medicines to control cholesterol as directed by your health care provider.  Manage your diabetes.  A controlled-carbohydrate, controlled-sugar diet is recommended to manage diabetes.  Take any prescribed medicines to control diabetes as directed by your health care provider.  Control your hypertension.  A low-salt (sodium), low-saturated fat, low-trans fat, and low-cholesterol diet is recommended to manage hypertension.  Take any prescribed medicines to control hypertension as directed by your health care provider.  Maintain a healthy weight.  A reduced-calorie, low-sodium, low-saturated fat, low-trans fat, low-cholesterol diet is recommended to manage weight.  Stop drug abuse.  Avoid taking birth control pills.  Talk to your health care provider about the risks of taking birth control pills if you are over 101 years old, smoke, get migraines, or have ever had a blood clot.  Get evaluated for sleep disorders (sleep apnea).  Talk to your health care provider about  getting a sleep evaluation if you snore a lot or have excessive sleepiness.  Take medicines as directed by your health care provider.  For some people, aspirin or blood thinners (anticoagulants) are helpful in reducing the risk of forming abnormal blood clots that can lead to stroke. If you have the irregular heart rhythm of atrial fibrillation, you should be on a blood thinner unless there is a good reason you cannot take them.  Understand all your medicine instructions.  Make sure that other other conditions (such as anemia or atherosclerosis) are addressed. SEEK IMMEDIATE MEDICAL CARE IF:   You have sudden weakness or numbness of the face, arm, or leg, especially on one side of the body.  Your face or eyelid droops to one side.  You have sudden confusion.  You have trouble speaking (aphasia) or understanding.  You have sudden trouble seeing in one or both eyes.  You have sudden trouble walking.  You have dizziness.  You have a loss of balance or coordination.  You have a sudden, severe headache with no known cause.  You have new chest pain or an irregular heartbeat. Any of these symptoms may represent a serious problem that is an emergency. Do not wait to see if the symptoms will go away. Get medical help at once. Call your local emergency services  (911 in [place] Do not drive yourself to the hospital. Document Released: 04/15/2004 Document Revised: 12/27/2012 Document Reviewed: 09/08/2012 [facility] Patient Information 2014 [place].   Smoking Cessation Quitting smoking is important to your health and has many advantages. However, it is not always easy to quit since nicotine is a  very addictive drug. Often times, people try 3 times or more before being able to quit. This document explains the best ways for you to prepare to quit smoking. Quitting takes hard work and a lot of effort, but you can do it. ADVANTAGES OF QUITTING SMOKING  You will live longer, feel better,  and live better.  Your body will feel the impact of quitting smoking almost immediately.  Within 20 minutes, blood pressure decreases. Your pulse returns to its normal level.  After 8 hours, carbon monoxide levels in the blood return to normal. Your oxygen level increases.  After 24 hours, the chance of having a heart attack starts to decrease. Your breath, hair, and body stop smelling like smoke.  After 48 hours, damaged nerve endings begin to recover. Your sense of taste and smell improve.  After 72 hours, the body is virtually free of nicotine. Your bronchial tubes relax and breathing becomes easier.  After 2 to 12 weeks, lungs can hold more air. Exercise becomes easier and circulation improves.  The risk of having a heart attack, stroke, cancer, or lung disease is greatly reduced.  After 1 year, the risk of coronary heart disease is cut in half.  After 5 years, the risk of stroke falls to the same as a nonsmoker.  After 10 years, the risk of lung cancer is cut in half and the risk of other cancers decreases significantly.  After 15 years, the risk of coronary heart disease drops, usually to the level of a nonsmoker.  If you are pregnant, quitting smoking will improve your chances of having a healthy baby.  The people you live with, especially any children, will be healthier.  You will have extra money to spend on things other than cigarettes. QUESTIONS TO THINK ABOUT BEFORE ATTEMPTING TO QUIT You may want to talk about your answers with your caregiver.  Why do you want to quit?  If you tried to quit in the past, what helped and what did not?  What will be the most difficult situations for you after you quit? How will you plan to handle them?  Who can help you through the tough times? Your family? Friends? A caregiver?  What pleasures do you get from smoking? What ways can you still get pleasure if you quit? Here are some questions to ask your caregiver:  How can you  help me to be successful at quitting?  What medicine do you think would be best for me and how should I take it?  What should I do if I need more help?  What is smoking withdrawal like? How can I get information on withdrawal? GET READY  Set a quit date.  Change your environment by getting rid of all cigarettes, ashtrays, matches, and lighters in your home, car, or work. Do not let people smoke in your home.  Review your past attempts to quit. Think about what worked and what did not. GET SUPPORT AND ENCOURAGEMENT You have a better chance of being successful if you have help. You can get support in many ways.  Tell your family, friends, and co-workers that you are going to quit and need their support. Ask them not to smoke around you.  Get individual, group, or telephone counseling and support. Programs are available at [employer] and health centers. Call your local health department for information about programs in your area.  Spiritual beliefs and practices may help some smokers quit.  Download a "quit meter" on your computer  to keep track of quit statistics, such as how long you have gone without smoking, cigarettes not smoked, and money saved.  Get a self-help book about quitting smoking and staying off of tobacco. [place] yourself from urges to smoke. Talk to someone, go for a walk, or occupy your time with a task.  Change your normal routine. Take a different route to work. Drink tea instead of coffee. Eat breakfast in a different place.  Reduce your stress. Take a hot bath, exercise, or read a book.  Plan something enjoyable to do every day. Reward yourself for not smoking.  Explore interactive web-based programs that specialize in helping you quit. GET MEDICINE AND USE IT CORRECTLY Medicines can help you stop smoking and decrease the urge to smoke. Combining medicine with the above behavioral methods and support can greatly increase  your chances of successfully quitting smoking.  Nicotine replacement therapy helps deliver nicotine to your body without the negative effects and risks of smoking. Nicotine replacement therapy includes nicotine gum, lozenges, inhalers, nasal sprays, and skin patches. Some may be available over-the-counter and others require a prescription.  Antidepressant medicine helps people abstain from smoking, but how this works is unknown. This medicine is available by prescription.  Nicotinic receptor partial agonist medicine simulates the effect of nicotine in your brain. This medicine is available by prescription. Ask your caregiver for advice about which medicines to use and how to use them based on your health history. Your caregiver will tell you what side effects to look out for if you choose to be on a medicine or therapy. Carefully read the information on the package. Do not use any other product containing nicotine while using a nicotine replacement product.  RELAPSE OR DIFFICULT SITUATIONS Most relapses occur within the first 3 months after quitting. Do not be discouraged if you start smoking again. Remember, most people try several times before finally quitting. You may have symptoms of withdrawal because your body is used to nicotine. You may crave cigarettes, be irritable, feel very hungry, cough often, get headaches, or have difficulty concentrating. The withdrawal symptoms are only temporary. They are strongest when you first quit, but they will go away within 10 14 days. To reduce the chances of relapse, try to:  Avoid drinking alcohol. Drinking lowers your chances of successfully quitting.  Reduce the amount of caffeine you consume. Once you quit smoking, the amount of caffeine in your body increases and can give you symptoms, such as a rapid heartbeat, sweating, and anxiety.  Avoid smokers because they can make you want to smoke.  Do not let weight gain distract you. Many smokers will gain  weight when they quit, usually less than 10 pounds. Eat a healthy diet and stay active. You can always lose the weight gained after you quit.  Find ways to improve your mood other than smoking. FOR MORE INFORMATION  www.smokefree.gov  Document Released: 03/02/2001 Document Revised: 09/07/2011 Document Reviewed: 06/17/2011 [facility] Patient Information 2014 [place], [place].

## 2013-09-18 ENCOUNTER — Other Ambulatory Visit: Payer: Self-pay | Admitting: Gastroenterology

## 2013-09-18 DIAGNOSIS — R855 Abnormal microbiological findings in specimens from digestive organs and abdominal cavity: Secondary | ICD-10-CM

## 2013-09-24 ENCOUNTER — Other Ambulatory Visit: Payer: Self-pay | Admitting: Gastroenterology

## 2013-09-24 ENCOUNTER — Ambulatory Visit
Admission: RE | Admit: 2013-09-24 | Discharge: 2013-09-24 | Disposition: A | Discharge status: Home / Self Care | Payer: Medicare Other | Source: Ambulatory Visit | Attending: Gastroenterology | Admitting: Gastroenterology

## 2013-09-24 DIAGNOSIS — R855 Abnormal microbiological findings in specimens from digestive organs and abdominal cavity: Secondary | ICD-10-CM

## 2014-04-08 ENCOUNTER — Emergency Department (HOSPITAL_BASED_OUTPATIENT_CLINIC_OR_DEPARTMENT_OTHER): Payer: Medicare Other

## 2014-04-08 ENCOUNTER — Encounter (HOSPITAL_BASED_OUTPATIENT_CLINIC_OR_DEPARTMENT_OTHER): Payer: Self-pay | Admitting: Family Medicine

## 2014-04-08 ENCOUNTER — Inpatient Hospital Stay (HOSPITAL_BASED_OUTPATIENT_CLINIC_OR_DEPARTMENT_OTHER)
Admission: EM | Admit: 2014-04-08 | Discharge: 2014-04-10 | DRG: 193 | Disposition: A | Discharge status: Home / Self Care | Payer: Medicare Other | Attending: Internal Medicine | Admitting: Internal Medicine

## 2014-04-08 DIAGNOSIS — Z8673 Personal history of transient ischemic attack (TIA), and cerebral infarction without residual deficits: Secondary | ICD-10-CM | POA: Diagnosis not present

## 2014-04-08 DIAGNOSIS — Z90710 Acquired absence of both cervix and uterus: Secondary | ICD-10-CM | POA: Diagnosis not present

## 2014-04-08 DIAGNOSIS — Z9842 Cataract extraction status, left eye: Secondary | ICD-10-CM | POA: Diagnosis not present

## 2014-04-08 DIAGNOSIS — Z9841 Cataract extraction status, right eye: Secondary | ICD-10-CM | POA: Diagnosis not present

## 2014-04-08 DIAGNOSIS — Z888 Allergy status to other drugs, medicaments and biological substances status: Secondary | ICD-10-CM | POA: Diagnosis not present

## 2014-04-08 DIAGNOSIS — Z87891 Personal history of nicotine dependence: Secondary | ICD-10-CM | POA: Diagnosis not present

## 2014-04-08 DIAGNOSIS — D509 Iron deficiency anemia, unspecified: Secondary | ICD-10-CM | POA: Diagnosis present

## 2014-04-08 DIAGNOSIS — Z681 Body mass index (BMI) 19 or less, adult: Secondary | ICD-10-CM

## 2014-04-08 DIAGNOSIS — J189 Pneumonia, unspecified organism: Secondary | ICD-10-CM | POA: Diagnosis not present

## 2014-04-08 DIAGNOSIS — E039 Hypothyroidism, unspecified: Secondary | ICD-10-CM | POA: Diagnosis present

## 2014-04-08 DIAGNOSIS — J449 Chronic obstructive pulmonary disease, unspecified: Secondary | ICD-10-CM | POA: Diagnosis present

## 2014-04-08 DIAGNOSIS — Z961 Presence of intraocular lens: Secondary | ICD-10-CM | POA: Diagnosis present

## 2014-04-08 DIAGNOSIS — I739 Peripheral vascular disease, unspecified: Secondary | ICD-10-CM | POA: Diagnosis present

## 2014-04-08 DIAGNOSIS — Z9981 Dependence on supplemental oxygen: Secondary | ICD-10-CM

## 2014-04-08 DIAGNOSIS — J9611 Chronic respiratory failure with hypoxia: Secondary | ICD-10-CM | POA: Diagnosis present

## 2014-04-08 DIAGNOSIS — I10 Essential (primary) hypertension: Secondary | ICD-10-CM | POA: Diagnosis present

## 2014-04-08 DIAGNOSIS — R05 Cough: Secondary | ICD-10-CM | POA: Diagnosis not present

## 2014-04-08 DIAGNOSIS — E785 Hyperlipidemia, unspecified: Secondary | ICD-10-CM | POA: Diagnosis present

## 2014-04-08 DIAGNOSIS — Z7902 Long term (current) use of antithrombotics/antiplatelets: Secondary | ICD-10-CM

## 2014-04-08 DIAGNOSIS — Z7982 Long term (current) use of aspirin: Secondary | ICD-10-CM | POA: Diagnosis not present

## 2014-04-08 DIAGNOSIS — E43 Unspecified severe protein-calorie malnutrition: Secondary | ICD-10-CM | POA: Diagnosis present

## 2014-04-08 DIAGNOSIS — J440 Chronic obstructive pulmonary disease with acute lower respiratory infection: Secondary | ICD-10-CM

## 2014-04-08 DIAGNOSIS — Z885 Allergy status to narcotic agent status: Secondary | ICD-10-CM

## 2014-04-08 LAB — BASIC METABOLIC PANEL
Anion gap: 9 (ref 5–15)
BUN: 16 mg/dL (ref 6–23)
CO2: 28 mmol/L (ref 19–32)
Calcium: 9.1 mg/dL (ref 8.4–10.5)
Chloride: 99 mEq/L (ref 96–112)
Creatinine, Ser: 0.74 mg/dL (ref 0.50–1.10)
GFR, EST NON AFRICAN AMERICAN: 85 mL/min — AB (ref >90)
Glucose, Bld: 126 mg/dL — ABNORMAL HIGH (ref 70–99)
Potassium: 3.8 mmol/L (ref 3.5–5.1)
Sodium: 136 mmol/L (ref 135–145)

## 2014-04-08 LAB — URINE MICROSCOPIC-ADD ON

## 2014-04-08 LAB — URINALYSIS, ROUTINE W REFLEX MICROSCOPIC
BILIRUBIN URINE: NEGATIVE
Glucose, UA: NEGATIVE mg/dL
Ketones, ur: NEGATIVE mg/dL
Leukocytes, UA: NEGATIVE
Nitrite: NEGATIVE
Protein, ur: NEGATIVE mg/dL
Specific Gravity, Urine: 1.006 (ref 1.005–1.030)
Urobilinogen, UA: 0.2 mg/dL (ref 0.0–1.0)
pH: 6 (ref 5.0–8.0)

## 2014-04-08 LAB — CBC WITH DIFFERENTIAL/PLATELET
Basophils Absolute: 0 10*3/uL (ref 0.0–0.1)
Basophils Relative: 0 % (ref 0–1)
EOS PCT: 0 % (ref 0–5)
Eosinophils Absolute: 0 10*3/uL (ref 0.0–0.7)
HEMATOCRIT: 37.3 % (ref 36.0–46.0)
HEMOGLOBIN: 11.8 g/dL — AB (ref 12.0–15.0)
Lymphocytes Relative: 6 % — ABNORMAL LOW (ref 12–46)
Lymphs Abs: 0.9 10*3/uL (ref 0.7–4.0)
MCH: 32.3 pg (ref 26.0–34.0)
MCHC: 31.6 g/dL (ref 30.0–36.0)
MCV: 102.2 fL — ABNORMAL HIGH (ref 78.0–100.0)
Monocytes Absolute: 1.4 10*3/uL — ABNORMAL HIGH (ref 0.1–1.0)
Monocytes Relative: 9 % (ref 3–12)
NEUTROS ABS: 13.3 10*3/uL — AB (ref 1.7–7.7)
Neutrophils Relative %: 85 % — ABNORMAL HIGH (ref 43–77)
Platelets: 233 10*3/uL (ref 150–400)
RBC: 3.65 MIL/uL — ABNORMAL LOW (ref 3.87–5.11)
RDW: 12.9 % (ref 11.5–15.5)
WBC: 15.6 10*3/uL — ABNORMAL HIGH (ref 4.0–10.5)

## 2014-04-08 LAB — TROPONIN I: Troponin I: <0.03 ng/mL (ref <0.031)

## 2014-04-08 MED ORDER — ASPIRIN 81 MG PO CHEW
81.0000 mg | CHEWABLE_TABLET | Freq: Every day | ORAL | Status: DC
  Administered 2014-04-09 – 2014-04-10 (×2): 81 mg via ORAL
  Filled 2014-04-08 (×2): qty 1

## 2014-04-08 MED ORDER — IPRATROPIUM BROMIDE 0.02 % IN SOLN
2.0000 mg | Freq: Four times a day (QID) | RESPIRATORY_TRACT | Status: DC
  Administered 2014-04-08: 2 mg via RESPIRATORY_TRACT
  Filled 2014-04-08: qty 10

## 2014-04-08 MED ORDER — LEVOTHYROXINE SODIUM 25 MCG PO TABS
25.0000 ug | ORAL_TABLET | Freq: Every day | ORAL | Status: DC
  Administered 2014-04-09 – 2014-04-10 (×2): 25 ug via ORAL
  Filled 2014-04-08 (×3): qty 1

## 2014-04-08 MED ORDER — ACETAMINOPHEN 325 MG PO TABS
650.0000 mg | ORAL_TABLET | Freq: Four times a day (QID) | ORAL | Status: DC | PRN
  Administered 2014-04-09: 650 mg via ORAL
  Filled 2014-04-08: qty 2

## 2014-04-08 MED ORDER — ALBUTEROL SULFATE (2.5 MG/3ML) 0.083% IN NEBU: 2.5000 mg | INHALATION_SOLUTION | Freq: Four times a day (QID) | RESPIRATORY_TRACT | Status: DC | PRN

## 2014-04-08 MED ORDER — FERROUS SULFATE 325 (65 FE) MG PO TABS
325.0000 mg | ORAL_TABLET | Freq: Every evening | ORAL | Status: DC
  Administered 2014-04-09: 325 mg via ORAL
  Filled 2014-04-08 (×2): qty 1

## 2014-04-08 MED ORDER — HYDROCODONE-ACETAMINOPHEN 5-325 MG PO TABS
1.0000 | ORAL_TABLET | Freq: Four times a day (QID) | ORAL | Status: DC | PRN
  Administered 2014-04-09 (×2): 1 via ORAL
  Filled 2014-04-08 (×2): qty 1

## 2014-04-08 MED ORDER — PROVENTIL 90 MCG/ACT IN AERS: 2.0000 | INHALATION_SPRAY | Freq: Four times a day (QID) | RESPIRATORY_TRACT | Status: DC | PRN

## 2014-04-08 MED ORDER — HEPARIN SODIUM (PORCINE) 5000 UNIT/ML IJ SOLN
5000.0000 [IU] | Freq: Three times a day (TID) | INTRAMUSCULAR | Status: DC
  Filled 2014-04-08 (×7): qty 1

## 2014-04-08 MED ORDER — CLOPIDOGREL BISULFATE 75 MG PO TABS
75.0000 mg | ORAL_TABLET | Freq: Every day | ORAL | Status: DC
  Administered 2014-04-09 – 2014-04-10 (×2): 75 mg via ORAL
  Filled 2014-04-08 (×3): qty 1

## 2014-04-08 MED ORDER — IPRATROPIUM-ALBUTEROL 0.5-2.5 (3) MG/3ML IN SOLN
3.0000 mL | Freq: Four times a day (QID) | RESPIRATORY_TRACT | Status: DC
  Administered 2014-04-09 – 2014-04-10 (×6): 3 mL via RESPIRATORY_TRACT
  Filled 2014-04-08 (×6): qty 3

## 2014-04-08 MED ORDER — AZITHROMYCIN 500 MG IV SOLR
500.0000 mg | Freq: Once | INTRAVENOUS | Status: AC
  Administered 2014-04-08 (×2): 500 mg via INTRAVENOUS
  Filled 2014-04-08: qty 500

## 2014-04-08 MED ORDER — VITAMIN D3 25 MCG (1000 UNIT) PO TABS
1000.0000 [IU] | ORAL_TABLET | Freq: Every day | ORAL | Status: DC
  Administered 2014-04-08 – 2014-04-10 (×3): 1000 [IU] via ORAL
  Filled 2014-04-08 (×3): qty 1

## 2014-04-08 MED ORDER — PANTOPRAZOLE SODIUM 40 MG PO TBEC
40.0000 mg | DELAYED_RELEASE_TABLET | Freq: Every day | ORAL | Status: DC
  Administered 2014-04-08 – 2014-04-10 (×3): 40 mg via ORAL
  Filled 2014-04-08 (×3): qty 1

## 2014-04-08 MED ORDER — CEFTRIAXONE SODIUM 1 G IJ SOLR
INTRAMUSCULAR | Status: AC
  Filled 2014-04-08: qty 10

## 2014-04-08 MED ORDER — SODIUM CHLORIDE 0.9 % IV SOLN
INTRAVENOUS | Status: DC
  Administered 2014-04-08 – 2014-04-09 (×2): via INTRAVENOUS

## 2014-04-08 MED ORDER — ROSUVASTATIN CALCIUM 10 MG PO TABS
10.0000 mg | ORAL_TABLET | ORAL | Status: DC
  Filled 2014-04-08: qty 1

## 2014-04-08 MED ORDER — CEFTRIAXONE SODIUM IN DEXTROSE 20 MG/ML IV SOLN
1.0000 g | INTRAVENOUS | Status: DC
  Administered 2014-04-09: 1 g via INTRAVENOUS
  Filled 2014-04-08 (×2): qty 50

## 2014-04-08 MED ORDER — FOLIC ACID 1 MG PO TABS
1.0000 mg | ORAL_TABLET | Freq: Every day | ORAL | Status: DC
  Administered 2014-04-08 – 2014-04-10 (×3): 1 mg via ORAL
  Filled 2014-04-08 (×3): qty 1

## 2014-04-08 MED ORDER — ALBUTEROL SULFATE (2.5 MG/3ML) 0.083% IN NEBU
2.5000 mg | INHALATION_SOLUTION | RESPIRATORY_TRACT | Status: DC
  Administered 2014-04-08: 2.5 mg via RESPIRATORY_TRACT
  Filled 2014-04-08: qty 3

## 2014-04-08 MED ORDER — ALPRAZOLAM 0.5 MG PO TABS
0.5000 mg | ORAL_TABLET | Freq: Every evening | ORAL | Status: DC | PRN
  Administered 2014-04-09 (×2): 0.5 mg via ORAL
  Filled 2014-04-08 (×2): qty 1

## 2014-04-08 MED ORDER — DEXTROSE 5 % IV SOLN
1.0000 g | Freq: Once | INTRAVENOUS | Status: AC
  Administered 2014-04-08: 1 g via INTRAVENOUS

## 2014-04-08 MED ORDER — DEXTROSE 5 % IV SOLN
500.0000 mg | INTRAVENOUS | Status: DC
  Administered 2014-04-09: 500 mg via INTRAVENOUS
  Filled 2014-04-08 (×2): qty 500

## 2014-04-08 MED ORDER — FLUTICASONE PROPIONATE 50 MCG/ACT NA SUSP
1.0000 | Freq: Every day | NASAL | Status: DC
  Administered 2014-04-08 – 2014-04-10 (×3): 1 via NASAL
  Filled 2014-04-08: qty 16

## 2014-04-08 MED ORDER — IPRATROPIUM-ALBUTEROL 0.5-2.5 (3) MG/3ML IN SOLN
3.0000 mL | Freq: Once | RESPIRATORY_TRACT | Status: AC
  Administered 2014-04-08: 3 mL via RESPIRATORY_TRACT
  Filled 2014-04-08: qty 3

## 2014-04-08 MED ORDER — BUDESONIDE-FORMOTEROL FUMARATE 80-4.5 MCG/ACT IN AERO
2.0000 | INHALATION_SPRAY | Freq: Two times a day (BID) | RESPIRATORY_TRACT | Status: DC
  Administered 2014-04-08 – 2014-04-10 (×4): 2 via RESPIRATORY_TRACT
  Filled 2014-04-08: qty 6.9

## 2014-04-08 MED ORDER — ALBUTEROL SULFATE (2.5 MG/3ML) 0.083% IN NEBU: 2.5000 mg | INHALATION_SOLUTION | RESPIRATORY_TRACT | Status: DC | PRN

## 2014-04-08 MED ORDER — CALCIUM CARBONATE-VITAMIN D 500-200 MG-UNIT PO TABS
1.0000 | ORAL_TABLET | Freq: Every day | ORAL | Status: DC
  Administered 2014-04-08 – 2014-04-10 (×3): 1 via ORAL
  Filled 2014-04-08 (×3): qty 1

## 2014-04-08 NOTE — ED Notes (Signed)
Transporting by [employer] with [facility] -room [id number].

## 2014-04-08 NOTE — Progress Notes (Signed)
Transfer from [facility] to [facility] medsurg for CAP. H/o COPD. Initially tachypneic. Better after duoneb.  VS otherwise stable. CALL FLOW MANAGER ON ARRIVAL TO UNIT AND THEY WILL PAGE HOSPITALIST TO WRITE ADMISSION ORDERS [phone]  Doree Barthel, MD

## 2014-04-08 NOTE — ED Notes (Addendum)
Pt c/o shortness of breath x 1 wk and fever to 102 at home this morning. Pt sts she had chest pain this morning but at present has pain to rib in back.

## 2014-04-08 NOTE — ED Provider Notes (Signed)
CSN: [id number]     Arrival date & time 04/08/14  1013 History   First MD Initiated Contact with Patient 04/08/14 1021     Chief Complaint  Patient presents with  . Shortness of Breath     (Consider location/radiation/quality/duration/timing/severity/associated sxs/prior Treatment) HPI Comments: Pt comes in with c/o myalgias, fever, cough and sob. For the last 2 days. Pt states that she is hurting all over. She has not used her albuterol this morning she did use her symbicort. Pt has oxygen requirement at home. States that she is having some substernal cp but it feels like the rest of her body with the aches. She state that she feels week. Hasn't tried anything for the symptoms. Denies swelling or history of heart failure. Nothing makes the sob better or worse  The history is provided by the patient. No language interpreter was used.    Past Medical History  Diagnosis Date  . Hypertension   . COPD (chronic obstructive pulmonary disease)   . Hyperlipemia   . Carotid artery occlusion   . Anemia   . Stroke    Past Surgical History  Procedure Laterality Date  . Back surgery    . Joint replacement    . Abdominal hysterectomy    . Tubal ligation    . Hemorrhoid surgery  1985  . Cataract extraction w/ intraocular lens  implant, bilateral Bilateral     R 1998, L 2000  . Shoulder arthroscopy Left    No family history on file. History  Substance Use Topics  . Smoking status: Current Every Day Smoker -- 1.00 packs/day for 60 years    Types: Cigarettes  . Smokeless tobacco: Never Used     Comment: smoking cessation info given and reviewed  . Alcohol Use: No   OB History    No data available     Review of Systems  All other systems reviewed and are negative.     Allergies  Fexofenadine; Loratadine; Morphine and related; and Pseudoephedrine  Home Medications   Prior to Admission medications   Medication Sig Start Date End Date Taking? Authorizing Provider  albuterol  (PROVENTIL) 90 MCG/ACT inhaler Inhale 2 puffs into the lungs every 6 (six) hours as needed.      Historical Provider, MD  alendronate (FOSAMAX) 70 MG tablet Take 70 mg by mouth every 7 (seven) days. Take with a full glass of water on an empty stomach.     Historical Provider, MD  ALPRAZolam Duanne Moron) 0.5 MG tablet Take 0.5 mg by mouth at bedtime as needed.      Historical Provider, MD  aspirin 81 MG tablet Take 81 mg by mouth daily.    Historical Provider, MD  budesonide-formoterol (SYMBICORT) 80-4.5 MCG/ACT inhaler Inhale 2 puffs into the lungs 2 (two) times daily.      Historical Provider, MD  calcium-vitamin D (OSCAL) 250-125 MG-UNIT per tablet Take 1 tablet by mouth daily.      Historical Provider, MD  Cholecalciferol (VITAMIN D3) 1000 UNITS CAPS Take 1 capsule by mouth daily.    Historical Provider, MD  clopidogrel (PLAVIX) 75 MG tablet Take 1 tablet (75 mg total) by mouth daily. 02/02/13   Conrad [place], MD  Cyanocobalamin (VITAMIN B-12 PO) Take by mouth as needed.    Historical Provider, MD  ferrous sulfate 325 (65 FE) MG EC tablet Take 325 mg by mouth Nightly.     Historical Provider, MD  folic acid (FOLVITE) 1 MG tablet Take 1 mg by  mouth daily.    Historical Provider, MD  HYDROcodone-acetaminophen (VICODIN) 5-500 MG per tablet Take 1 tablet by mouth every 6 (six) hours as needed.      Historical Provider, MD  ipratropium (ATROVENT) 0.02 % nebulizer solution Inhale 2 mg into the lungs 4 (four) times daily.      Historical Provider, MD  levothyroxine (SYNTHROID, LEVOTHROID) 25 MCG tablet Take 25 mcg by mouth daily.      Historical Provider, MD  mometasone (NASONEX) 50 MCG/ACT nasal spray Place 2 sprays into the nose daily.      Historical Provider, MD  OXYGEN-HELIUM IN Inhale 2 L/hr into the lungs daily.    Historical Provider, MD  pantoprazole (PROTONIX) 40 MG tablet Take 40 mg by mouth daily.    Historical Provider, MD  predniSONE (DELTASONE) 10 MG tablet Take 4 tablets (40 mg total) by mouth  daily. 07/23/13   Fredia Sorrow, MD  ramipril (ALTACE) 2.5 MG tablet Take 2.5 mg by mouth daily.      Historical Provider, MD  rosuvastatin (CRESTOR) 20 MG tablet Take 20 mg by mouth daily.      Historical Provider, MD   BP 102/56 mmHg  Pulse 113  Temp(Src) 99 F (37.2 C) (Oral)  Resp 28  Ht 5\' 2"  (1.575 m)  Wt 95 lb 8 oz (43.319 kg)  BMI 17.46 kg/m2  SpO2 98% Physical Exam  Constitutional: She is oriented to person, place, and time. She appears well-developed and well-nourished.  HENT:  Head: Normocephalic and atraumatic.  Right Ear: External ear normal.  Left Ear: External ear normal.  Eyes: Conjunctivae and EOM are normal.  Neck: Neck supple.  Cardiovascular: Normal rate and regular rhythm.   Pulmonary/Chest: She is in respiratory distress.  Abdominal: Soft. Bowel sounds are normal.  Musculoskeletal: Normal range of motion.  Neurological: She is alert and oriented to person, place, and time.  Skin: Skin is warm and dry.  Psychiatric: She has a normal mood and affect.  Vitals reviewed.   ED Course  Procedures (including critical care time) Labs Review Labs Reviewed  BASIC METABOLIC PANEL - Abnormal; Notable for the following:    Glucose, Bld 126 (*)    GFR calc non Af Amer 85 (*)    All other components within normal limits  CBC WITH DIFFERENTIAL - Abnormal; Notable for the following:    WBC 15.6 (*)    RBC 3.65 (*)    Hemoglobin 11.8 (*)    MCV 102.2 (*)    Neutrophils Relative % 85 (*)    Neutro Abs 13.3 (*)    Lymphocytes Relative 6 (*)    Monocytes Absolute 1.4 (*)    All other components within normal limits  TROPONIN I  URINALYSIS, ROUTINE W REFLEX MICROSCOPIC    Imaging Review Dg Chest 2 View  04/08/2014   CLINICAL DATA:  Cough and difficulty breathing; history of COPD and hypertension.  EXAM: CHEST  2 VIEW  COMPARISON:  Jul 23, 2013  FINDINGS: There is underlying emphysema. There is fibrotic change in both upper lobes, more on the left than on the  right. In comparison with the prior study, there is increased opacity in the posterior segment of the left upper lobe.  The heart size is normal. The pulmonary vascularity reflects underlying emphysema. There is old rib trauma bilaterally. There is postoperative change in the cervical spine.  IMPRESSION: There is increased opacity in the posterior segment left upper lobe compared to prior study. Suspect pneumonia superimposed on  fibrotic change. Followup study in 7-10 days advised to assess for clearing. If clearing has not occurred during that time, would advise noncontrast enhanced chest CT at that time to assess for the possibility of underlying mass in the posterior segment of the left elsewhere, areas of fibrosis are stable. Underlying emphysema is present. No change in cardiac silhouette.   Electronically Signed   By: Lowella Grip M.D.   On: 04/08/2014 11:46     Date: 04/08/2014  Rate: 108  Rhythm: sinus tachycardia  QRS Axis: normal  Intervals: normal  ST/T Wave abnormalities: normal  Conduction Disutrbances:none  Narrative Interpretation:   Old EKG Reviewed:     MDM   Final diagnoses:  CAP (community acquired pneumonia)    Think pt would be best served in hospital as on coming in pt was tachypneic and tachycardic. Will [facility] to [facility];cap pt has not had any recent admissions    Glendell Docker, NP 04/08/14 [place], MD 04/08/14 [place], MD 04/08/14 [phone]

## 2014-04-08 NOTE — ED Notes (Signed)
Pt returned from xray. She sts she is breathing much better after her breathing tx. Pt is in NAd and denies any needs at this time. Pt attempted to provide urine sample but dropped the cup into the toilet. She will attempt to provide another sample as soon as she can.

## 2014-04-08 NOTE — H&P (Signed)
[facility] History and Physical  Anne Morrow TDD:[id number] DOB: 25-Jan-1946 DOA: 04/08/2014  Referring physician: EDP PCP: Antony Blackbird, MD   Chief Complaint: Cough   HPI: Anne Morrow is a 69 y.o. female with h/o COPD on 2L home O2 at baseline.  Patient presents to ED with c/o cough, SOB, fever, malaise.  She had a head cold for the past 1 week, last evening however patient developed lower respiratory symptoms.  Given her h/o COPD she presented to the ED.  Work up in ED confirms PNA on CXR, Tm in ED is 99.1.  Review of Systems: Systems reviewed.  As above, otherwise negative  Past Medical History  Diagnosis Date  . Hypertension   . COPD (chronic obstructive pulmonary disease)   . Hyperlipemia   . Carotid artery occlusion   . Anemia   . Stroke    Past Surgical History  Procedure Laterality Date  . Back surgery    . Joint replacement    . Abdominal hysterectomy    . Tubal ligation    . Hemorrhoid surgery  1985  . Cataract extraction w/ intraocular lens  implant, bilateral Bilateral     R 1998, L 2000  . Shoulder arthroscopy Left    Social History:  reports that she quit smoking about 2 weeks ago. Her smoking use included Cigarettes. She has a 60 pack-year smoking history. She has never used smokeless tobacco. She reports that she does not drink alcohol or use illicit drugs.  Allergies  Allergen Reactions  . Fexofenadine Other (See Comments)    Hyper   . Loratadine Other (See Comments)    Hyper   . Pseudoephedrine Other (See Comments)    Hyper   . Morphine And Related Rash    No family history on file.   Prior to Admission medications   Medication Sig Start Date End Date Taking? Authorizing Provider  albuterol (PROVENTIL) 90 MCG/ACT inhaler Inhale 2 puffs into the lungs every 6 (six) hours as needed.     Yes Historical Provider, MD  ALPRAZolam Duanne Moron) 0.5 MG tablet Take 0.5 mg by mouth at bedtime as needed.     Yes Historical Provider, MD  aspirin 81  MG tablet Take 81 mg by mouth daily.   Yes Historical Provider, MD  budesonide-formoterol (SYMBICORT) 80-4.5 MCG/ACT inhaler Inhale 2 puffs into the lungs 2 (two) times daily.     Yes Historical Provider, MD  calcium-vitamin D (OSCAL) 250-125 MG-UNIT per tablet Take 1 tablet by mouth daily.     Yes Historical Provider, MD  Cholecalciferol (VITAMIN D3) 1000 UNITS CAPS Take 1 capsule by mouth daily.   Yes Historical Provider, MD  clopidogrel (PLAVIX) 75 MG tablet Take 1 tablet (75 mg total) by mouth daily. 02/02/13  Yes Conrad [place], MD  Cyanocobalamin (VITAMIN B-12 PO) Take 1 tablet by mouth as needed (Monday and Thursday).    Yes Historical Provider, MD  ferrous sulfate 325 (65 FE) MG EC tablet Take 325 mg by mouth Nightly.    Yes Historical Provider, MD  folic acid (FOLVITE) 1 MG tablet Take 1 mg by mouth daily.   Yes Historical Provider, MD  HYDROcodone-acetaminophen (VICODIN) 5-500 MG per tablet Take 1 tablet by mouth every 6 (six) hours as needed.     Yes Historical Provider, MD  ipratropium (ATROVENT) 0.02 % nebulizer solution Inhale 2 mg into the lungs 4 (four) times daily.     Yes Historical Provider, MD  levothyroxine (SYNTHROID, LEVOTHROID) 25 MCG  tablet Take 25 mcg by mouth daily.     Yes Historical Provider, MD  mometasone (NASONEX) 50 MCG/ACT nasal spray Place 2 sprays into the nose daily.     Yes Historical Provider, MD  pantoprazole (PROTONIX) 40 MG tablet Take 40 mg by mouth daily.   Yes Historical Provider, MD  rosuvastatin (CRESTOR) 20 MG tablet Take 10 mg by mouth See admin instructions. Monday and thursday   Yes Historical Provider, MD   Physical Exam: Filed Vitals:   04/08/14 1741  BP: 101/32  Pulse: 100  Temp: 99 F (37.2 C)  Resp: 18    BP 101/32 mmHg  Pulse 100  Temp(Src) 99 F (37.2 C) (Oral)  Resp 18  Ht 5\' 2"  (1.575 m)  Wt 43.9 kg (96 lb 12.5 oz)  BMI 17.70 kg/m2  SpO2 98%  General Appearance:    Alert, oriented, no distress, appears stated age  Head:     Normocephalic, atraumatic  Eyes:    PERRL, EOMI, sclera non-icteric        Nose:   Nares without drainage or epistaxis. Mucosa, turbinates normal  Throat:   Moist mucous membranes. Oropharynx without erythema or exudate.  Neck:   Supple. No carotid bruits.  No thyromegaly.  No lymphadenopathy.   Back:     No CVA tenderness, no spinal tenderness  Lungs:     Accessory muscle use noted,  Otherwise mostly clear at this time.  Chest wall:    No tenderness to palpitation  Heart:    Regular rate and rhythm without murmurs, gallops, rubs  Abdomen:     Soft, non-tender, nondistended, normal bowel sounds, no organomegaly  Genitalia:    deferred  Rectal:    deferred  Extremities:   No clubbing, cyanosis or edema.  Pulses:   2+ and symmetric all extremities  Skin:   Skin color, texture, turgor normal, no rashes or lesions  Lymph nodes:   Cervical, supraclavicular, and axillary nodes normal  Neurologic:   CNII-XII intact. Normal strength, sensation and reflexes      throughout    Labs on Admission:  Basic Metabolic Panel:  Recent Labs Lab 04/08/14 1055  NA 136  K 3.8  CL 99  CO2 28  GLUCOSE 126*  BUN 16  CREATININE 0.74  CALCIUM 9.1   Liver Function Tests: No results for input(s): AST, ALT, ALKPHOS, BILITOT, PROT, ALBUMIN in the last 168 hours. No results for input(s): LIPASE, AMYLASE in the last 168 hours. No results for input(s): AMMONIA in the last 168 hours. CBC:  Recent Labs Lab 04/08/14 1055  WBC 15.6*  NEUTROABS 13.3*  HGB 11.8*  HCT 37.3  MCV 102.2*  PLT 233   Cardiac Enzymes:  Recent Labs Lab 04/08/14 1055  TROPONINI <0.03    BNP (last 3 results) No results for input(s): PROBNP in the last 8760 hours. CBG: No results for input(s): GLUCAP in the last 168 hours.  Radiological Exams on Admission: Dg Chest 2 View  04/08/2014   CLINICAL DATA:  Cough and difficulty breathing; history of COPD and hypertension.  EXAM: CHEST  2 VIEW  COMPARISON:  Jul 23, 2013   FINDINGS: There is underlying emphysema. There is fibrotic change in both upper lobes, more on the left than on the right. In comparison with the prior study, there is increased opacity in the posterior segment of the left upper lobe.  The heart size is normal. The pulmonary vascularity reflects underlying emphysema. There is old rib trauma bilaterally. There  is postoperative change in the cervical spine.  IMPRESSION: There is increased opacity in the posterior segment left upper lobe compared to prior study. Suspect pneumonia superimposed on fibrotic change. Followup study in 7-10 days advised to assess for clearing. If clearing has not occurred during that time, would advise noncontrast enhanced chest CT at that time to assess for the possibility of underlying mass in the posterior segment of the left elsewhere, areas of fibrosis are stable. Underlying emphysema is present. No change in cardiac silhouette.   Electronically Signed   By: Lowella Grip M.D.   On: 04/08/2014 11:46    EKG: Independently reviewed.  Assessment/Plan Principal Problem:   CAP (community acquired pneumonia) Active Problems:   COPD (chronic obstructive pulmonary disease)   Chronic respiratory failure with hypoxia   1. CAP - 1. PNA pathway 2. Cultures pending 3. Rocephin and azithromycin 2. COPD - 1. On chronic 2L via [place] 2. Continue home nebs 3. Continuous pulse ox ordered. 4. Not really having much in the way of wheezing to indicate benefit of starting steroids at this time. 3. Hypothyroidism - continue synthroid 4. PAD - continue ASA and plavix    Code Status: Full Code  Family Communication: No family in room Disposition Plan: Admit to inpatient   Time spent: 70 min  Karsten Vaughn M. [facility] Pager [phone]  If 7AM-7PM, please contact the day team taking care of the patient [email or web address] Password [facility] 04/08/2014, 7:32 PM

## 2014-04-08 NOTE — ED Notes (Signed)
Report given to Dominica Severin, [place]

## 2014-04-08 NOTE — Progress Notes (Signed)
Flow Manager called to notify admitting hospitalist of patient's arrival.

## 2014-04-08 NOTE — ED Notes (Signed)
Cancelled [facility] on room 8 and [facility] is transporting

## 2014-04-08 NOTE — Progress Notes (Signed)
Flow manager called again because there was no response to the initial call.

## 2014-04-08 NOTE — Progress Notes (Signed)
No response from flow manager.  Dr Conley Canal paged

## 2014-04-09 DIAGNOSIS — J449 Chronic obstructive pulmonary disease, unspecified: Secondary | ICD-10-CM

## 2014-04-09 LAB — BASIC METABOLIC PANEL
Anion gap: 10 (ref 5–15)
BUN: 15 mg/dL (ref 6–23)
CHLORIDE: 104 meq/L (ref 96–112)
CO2: 27 mmol/L (ref 19–32)
Calcium: 9.8 mg/dL (ref 8.4–10.5)
Creatinine, Ser: 0.85 mg/dL (ref 0.50–1.10)
GFR calc Af Amer: 79 mL/min — ABNORMAL LOW (ref >90)
GFR, EST NON AFRICAN AMERICAN: 68 mL/min — AB (ref >90)
GLUCOSE: 164 mg/dL — AB (ref 70–99)
Potassium: 4.3 mmol/L (ref 3.5–5.1)
Sodium: 141 mmol/L (ref 135–145)

## 2014-04-09 LAB — STREP PNEUMONIAE URINARY ANTIGEN: Strep Pneumo Urinary Antigen: NEGATIVE

## 2014-04-09 LAB — CBC
HEMATOCRIT: 35 % — AB (ref 36.0–46.0)
Hemoglobin: 11.5 g/dL — ABNORMAL LOW (ref 12.0–15.0)
MCH: 33.3 pg (ref 26.0–34.0)
MCHC: 32.9 g/dL (ref 30.0–36.0)
MCV: 101.4 fL — ABNORMAL HIGH (ref 78.0–100.0)
PLATELETS: 280 10*3/uL (ref 150–400)
RBC: 3.45 MIL/uL — ABNORMAL LOW (ref 3.87–5.11)
RDW: 13.2 % (ref 11.5–15.5)
WBC: 9.6 10*3/uL (ref 4.0–10.5)

## 2014-04-09 LAB — GLUCOSE, CAPILLARY: GLUCOSE-CAPILLARY: 121 mg/dL — AB (ref 70–99)

## 2014-04-09 MED ORDER — ENSURE COMPLETE PO LIQD
237.0000 mL | Freq: Two times a day (BID) | ORAL | Status: DC
  Administered 2014-04-09 – 2014-04-10 (×2): 237 mL via ORAL

## 2014-04-09 MED ORDER — CETYLPYRIDINIUM CHLORIDE 0.05 % MT LIQD
7.0000 mL | Freq: Two times a day (BID) | OROMUCOSAL | Status: DC
  Administered 2014-04-09 – 2014-04-10 (×3): 7 mL via OROMUCOSAL

## 2014-04-09 MED ORDER — ACETAMINOPHEN 325 MG PO TABS: 650.0000 mg | ORAL_TABLET | Freq: Four times a day (QID) | ORAL | Status: DC | PRN

## 2014-04-09 NOTE — Progress Notes (Signed)
INITIAL NUTRITION ASSESSMENT  Pt meets criteria for SEVERE MALNUTRITION in the context of chronic illness as evidenced by severe fat and muscle mass loss.  DOCUMENTATION CODES Per approved criteria  -Severe malnutrition in the context of chronic illness -Underweight   INTERVENTION: Provide Ensure Complete po BID, each supplement provides 350 kcal and 13 grams of protein.  Encouraged adequate PO intake.  NUTRITION DIAGNOSIS: Increased nutrient needs related to chronic illness as evidenced by estimated nutrition needs.   Goal: Pt to meet >/= 90% of their estimated nutrition needs   Monitor:  PO intake, weight trends, labs, I/O's  Reason for Assessment: Low BMI  69 y.o. female  Admitting Dx: CAP (community acquired pneumonia)  ASSESSMENT: Pt with h/o COPD on 2L home O2 at baseline. Presents to ED with c/o cough, SOB, fever, malaise. She had a head cold for the past 1 week, last evening however patient developed lower respiratory symptoms.Work up confirms PNA on CXR, Tm in ED is 99.1.  Pt reports having a good appetite currently and PTA at home eating 3 meals a day along with a protein shake once daily. Pt reports she ate 100% of her meal this AM. Usual body weight is 100 lbs. Per Epic weight records, pt with a 4% weight loss in 8 months which is not found significant. Pt is agreeable to Ensure Complete. Will order. Pt was encouraged to eat her food at meals and to take her supplements. Pt was also education to increase her protein shake intake at home to at least twice daily to aid in caloric and protein needs as well as to prevent further weight loss.  Nutrition Focused Physical Exam:  Subcutaneous Fat:  Orbital Region: N/A Upper Arm Region: Severe depletion Thoracic and Lumbar Region: Moderate depletion  Muscle:  Temple Region: N/A Clavicle Bone Region: Severe depletion Clavicle and Acromion Bone Region: Severe depletion Scapular Bone Region: N/A Dorsal Hand:  N/A Patellar Region: Severe depletion Anterior Thigh Region: Severe depletion Posterior Calf Region: N/A  Edema: none  Labs: Low GFR.  Height: Ht Readings from Last 1 Encounters:  04/08/14 5\' 2"  (1.575 m)    Weight: Wt Readings from Last 1 Encounters:  04/08/14 96 lb 12.5 oz (43.9 kg)    Ideal Body Weight: 110 lbs  % Ideal Body Weight: 87%  Wt Readings from Last 10 Encounters:  04/08/14 96 lb 12.5 oz (43.9 kg)  08/24/13 98 lb 8 oz (44.679 kg)  07/23/13 100 lb (45.36 kg)  07/21/13 99 lb (44.906 kg)  02/23/13 101 lb 12.8 oz (46.176 kg)  02/02/13 99 lb 6.4 oz (45.088 kg)  12/30/12 100 lb (45.36 kg)  10/13/12 104 lb (47.174 kg)  05/07/11 121 lb (54.885 kg)  02/05/11 116 lb (52.617 kg)    Usual Body Weight: 100 lbs  % Usual Body Weight: 96%  BMI:  Body mass index is 17.7 kg/(m^2). Underweight  Estimated Nutritional Needs: Kcal: 1550-1800 Protein: 65-80 grams Fluid: 1.55 - 1.8 L/day  Skin: intact  Diet Order: Diet Heart  EDUCATION NEEDS: -Education needs addressed   Intake/Output Summary (Last 24 hours) at 04/09/14 0951 Last data filed at 04/08/14 2328  Gross per 24 hour  Intake      0 ml  Output    250 ml  Net   -250 ml    Last BM: 1/17  Labs:   Recent Labs Lab 04/08/14 1055 04/09/14 0349  NA 136 141  K 3.8 4.3  CL 99 104  CO2 28 27  BUN 16 15  CREATININE 0.74 0.85  CALCIUM 9.1 9.8  GLUCOSE 126* 164*    CBG (last 3)  No results for input(s): GLUCAP in the last 72 hours.  Scheduled Meds: . antiseptic oral rinse  7 mL Mouth Rinse BID  . aspirin  81 mg Oral Daily  . azithromycin  500 mg Intravenous Q24H  . budesonide-formoterol  2 puff Inhalation BID  . calcium-vitamin D  1 tablet Oral Daily  . cefTRIAXone (ROCEPHIN)  IV  1 g Intravenous Q24H  . cholecalciferol  1,000 Units Oral Daily  . clopidogrel  75 mg Oral Daily  . ferrous sulfate  325 mg Oral QPM  . fluticasone  1 spray Each Nare Daily  . folic acid  1 mg Oral Daily  .  heparin  5,000 Units Subcutaneous 3 times per day  . ipratropium-albuterol  3 mL Nebulization QID  . levothyroxine  25 mcg Oral Daily  . pantoprazole  40 mg Oral Daily  . rosuvastatin  10 mg Oral Once per day on Mon Thu    Continuous Infusions: . sodium chloride 75 mL/hr at 04/08/14 2054    Past Medical History  Diagnosis Date  . Hypertension   . COPD (chronic obstructive pulmonary disease)   . Hyperlipemia   . Carotid artery occlusion   . Anemia   . Stroke     Past Surgical History  Procedure Laterality Date  . Back surgery    . Joint replacement    . Abdominal hysterectomy    . Tubal ligation    . Hemorrhoid surgery  1985  . Cataract extraction w/ intraocular lens  implant, bilateral Bilateral     R 1998, L 2000  . Shoulder arthroscopy Left     Kallie Locks, MS, RD, LDN Pager # [phone] After hours/ weekend pager # [phone]

## 2014-04-09 NOTE — Progress Notes (Signed)
PROGRESS NOTE    Anne Morrow DZH:[id number] DOB: [birth year] DOA: 04/08/2014 PCP: Antony Blackbird, MD  HPI/Brief narrative 69 year old female with history of COPD, chronic respiratory failure on home oxygen 2 L/m, tobacco abuse/quit 03/22/2014, essential hypertension, HLD presented to the ED with cough, worsening dyspnea, fever and malaise. Workup in ED showed pneumonia on chest x-ray and MAXIMUM TEMPERATURE 99.33F.   Assessment/Plan:  1. Community acquired LUL pneumonia: Seen on chest x-ray. Continue IV Rocephin and azithromycin. The pneumonia seems to be superimposed on fibrotic change. Recommend repeating chest x-ray in 7-10 days to ensure clearing of findings and if not resolved, then CT chest without contrast to further evaluate fibrotic changes and for underlying mass. Improving. 2. COPD: Stable. No clinical bronchospasm. Quit smoking 03/22/14. 3. Tobacco abuse: Congratulated her on tobacco cessation and counseled to persist. 4. History of HLD: Continue statins. 5. Hypothyroid: Continue Synthroid. 6. History of stroke/PAD: Continue aspirin and Plavix. 7. Microcytic anemia: Unclear etiology. Stable. Continue iron supplements. Leukocytosis resolved. 8. Essential hypertension: Blood pressure soft. Does not seem to be on antihypertensives. Monitor   Code Status: Full Family Communication: None at bedside Disposition Plan: Home when medically stable, possibly in 48 hours.   Consultants:  None  Procedures:  None  Antibiotics:  IV Rocephin 1/18 >  IV azithromycin 1/18 >   Subjective: "I feel 100% better". Dyspnea improved. Body aches resolved. Minimal cough with white thick sputum. Denies chest pain. Asking for regular and not heart healthy diet.  Objective: Filed Vitals:   04/08/14 1700 04/08/14 1741 04/08/14 2151 04/09/14 0527  BP: 96/59 101/32 101/45 105/45  Pulse: 101 100 90 80  Temp: 99 F (37.2 C) 99 F (37.2 C) 98.3 F (36.8 C) 97.7 F (36.5 C)  TempSrc:  Oral Oral Oral Oral  Resp: 22 18 17 18   Height: 5\' 2"  (1.575 m)     Weight: 43.9 kg (96 lb 12.5 oz)     SpO2: 97% 98% 98% 97%    Intake/Output Summary (Last 24 hours) at 04/09/14 1128 Last data filed at 04/09/14 1105  Gross per 24 hour  Intake    270 ml  Output    600 ml  Net   -330 ml   Filed Weights   04/08/14 1021 04/08/14 1700  Weight: 43.319 kg (95 lb 8 oz) 43.9 kg (96 lb 12.5 oz)     Exam:  General exam: Pleasant middle-aged female sitting up comfortably in bed in no distress. Respiratory system: Slightly diminished breath sounds in the bases but no wheezing, crackles or rhonchi. Rest of lung fields clear to auscultation. No increased work of breathing. Cardiovascular system: S1 & S2 heard, RRR. No JVD, murmurs, gallops, clicks or pedal edema. Non-telemetry. Gastrointestinal system: Abdomen is nondistended, soft and nontender. Normal bowel sounds heard. Central nervous system: Alert and oriented. No focal neurological deficits. Extremities: Symmetric 5 x 5 power.   Data Reviewed: Basic Metabolic Panel:  Recent Labs Lab 04/08/14 1055 04/09/14 0349  NA 136 141  K 3.8 4.3  CL 99 104  CO2 28 27  GLUCOSE 126* 164*  BUN 16 15  CREATININE 0.74 0.85  CALCIUM 9.1 9.8   Liver Function Tests: No results for input(s): AST, ALT, ALKPHOS, BILITOT, PROT, ALBUMIN in the last 168 hours. No results for input(s): LIPASE, AMYLASE in the last 168 hours. No results for input(s): AMMONIA in the last 168 hours. CBC:  Recent Labs Lab 04/08/14 1055 04/09/14 0349  WBC 15.6* 9.6  NEUTROABS 13.3*  --  HGB 11.8* 11.5*  HCT 37.3 35.0*  MCV 102.2* 101.4*  PLT 233 280   Cardiac Enzymes:  Recent Labs Lab 04/08/14 1055  TROPONINI <0.03   BNP (last 3 results) No results for input(s): PROBNP in the last 8760 hours. CBG: No results for input(s): GLUCAP in the last 168 hours.  No results found for this or any previous visit (from the past 240 hour(s)).          Studies: Dg Chest 2 View  04/08/2014   CLINICAL DATA:  Cough and difficulty breathing; history of COPD and hypertension.  EXAM: CHEST  2 VIEW  COMPARISON:  Jul 23, 2013  FINDINGS: There is underlying emphysema. There is fibrotic change in both upper lobes, more on the left than on the right. In comparison with the prior study, there is increased opacity in the posterior segment of the left upper lobe.  The heart size is normal. The pulmonary vascularity reflects underlying emphysema. There is old rib trauma bilaterally. There is postoperative change in the cervical spine.  IMPRESSION: There is increased opacity in the posterior segment left upper lobe compared to prior study. Suspect pneumonia superimposed on fibrotic change. Followup study in 7-10 days advised to assess for clearing. If clearing has not occurred during that time, would advise noncontrast enhanced chest CT at that time to assess for the possibility of underlying mass in the posterior segment of the left elsewhere, areas of fibrosis are stable. Underlying emphysema is present. No change in cardiac silhouette.   Electronically Signed   By: Lowella Grip M.D.   On: 04/08/2014 11:46        Scheduled Meds: . antiseptic oral rinse  7 mL Mouth Rinse BID  . aspirin  81 mg Oral Daily  . azithromycin  500 mg Intravenous Q24H  . budesonide-formoterol  2 puff Inhalation BID  . calcium-vitamin D  1 tablet Oral Daily  . cefTRIAXone (ROCEPHIN)  IV  1 g Intravenous Q24H  . cholecalciferol  1,000 Units Oral Daily  . clopidogrel  75 mg Oral Daily  . ferrous sulfate  325 mg Oral QPM  . fluticasone  1 spray Each Nare Daily  . folic acid  1 mg Oral Daily  . heparin  5,000 Units Subcutaneous 3 times per day  . ipratropium-albuterol  3 mL Nebulization QID  . levothyroxine  25 mcg Oral Daily  . pantoprazole  40 mg Oral Daily  . rosuvastatin  10 mg Oral Once per day on Mon Thu   Continuous Infusions: . sodium chloride 75 mL/hr  at 04/09/14 1026    Principal Problem:   CAP (community acquired pneumonia) Active Problems:   COPD (chronic obstructive pulmonary disease)   Chronic respiratory failure with hypoxia    Time spent: 30 minutes.    Vernell Leep, MD, FACP, FHM. [facility] Pager [phone]  If 7PM-7AM, please contact night-coverage [email or web address] Password [email or web address] 04/09/2014, 11:28 AM    LOS: 1 day

## 2014-04-09 NOTE — Progress Notes (Signed)
UR Completed.  [phone]  

## 2014-04-10 DIAGNOSIS — Z8673 Personal history of transient ischemic attack (TIA), and cerebral infarction without residual deficits: Secondary | ICD-10-CM

## 2014-04-10 DIAGNOSIS — E43 Unspecified severe protein-calorie malnutrition: Secondary | ICD-10-CM | POA: Diagnosis present

## 2014-04-10 DIAGNOSIS — D509 Iron deficiency anemia, unspecified: Secondary | ICD-10-CM | POA: Diagnosis present

## 2014-04-10 DIAGNOSIS — I10 Essential (primary) hypertension: Secondary | ICD-10-CM

## 2014-04-10 DIAGNOSIS — E039 Hypothyroidism, unspecified: Secondary | ICD-10-CM | POA: Diagnosis present

## 2014-04-10 LAB — HIV ANTIBODY (ROUTINE TESTING W REFLEX): HIV 1/HIV 2 AB: NONREACTIVE

## 2014-04-10 MED ORDER — ACETAMINOPHEN 325 MG PO TABS: 650.0000 mg | ORAL_TABLET | Freq: Four times a day (QID) | ORAL | Status: DC | PRN

## 2014-04-10 MED ORDER — AZITHROMYCIN 500 MG PO TABS: 500.0000 mg | ORAL_TABLET | Freq: Every day | ORAL | Status: DC

## 2014-04-10 MED ORDER — CEFUROXIME AXETIL 500 MG PO TABS: 500.0000 mg | ORAL_TABLET | Freq: Two times a day (BID) | ORAL | Status: DC

## 2014-04-10 MED ORDER — ENSURE COMPLETE PO LIQD: 237.0000 mL | Freq: Two times a day (BID) | ORAL | Status: DC

## 2014-04-10 NOTE — Discharge Summary (Signed)
Physician Discharge Summary  Anne Morrow LMB:[id number] DOB: [birth year] DOA: 04/08/2014  PCP: Antony Blackbird, MD  Admit date: 04/08/2014 Discharge date: 04/10/2014  Time spent: 30 minutes  Recommendations for Outpatient Follow-up:  1. Follow up with PCP within one week after discharge 2. Continue using flutter valve after discharge 3. Chest x-ray done this admission showed pneumonia that appeared to be superimposed on fibrotic changes. Radiologist recommended repeating chest x-ray 7 days after discharge to ensure clearing of findings. If not resolved CT of the chest without contrast was recommended to further evaluate fibrotic changes and for underlying mass. 4. Patient has low BMI of 18.2-Ensure started this admission-suggest outpatient nutrition evaluation  Discharge Diagnoses:    CAP (community acquired pneumonia)-resolving   COPD (chronic obstructive pulmonary disease)-stable without wheezing   Chronic respiratory failure with hypoxia-2 L oxygen at home   Protein-calorie malnutrition, severe-started on oral supplementation   Microcytic anemia-baseline hemoglobin around 12   Hypothyroidism   History of CVA (cerebrovascular accident)   HTN (hypertension)   Discharge Condition: stable  Diet recommendation: Regular with Ensure and/or boost supplementation at least twice daily  Filed Weights   04/08/14 1021 04/08/14 1700 04/09/14 2053  Weight: 95 lb 8 oz (43.319 kg) 96 lb 12.5 oz (43.9 kg) 99 lb 4.8 oz (45.042 kg)    History of present illness:  69 year old female patient with COPD on 2 L home oxygen. She presented to the ER with cough, shortness of breath, fever and malaise. Reported symptoms consistent with "head cold" prior to presentation. For 24 hours prior to presentation she developed more lower respiratory symptoms with new cough and shortness of breath. Chest x-ray performed in the ER revealed pneumonia. She also had mild leukocytosis of 15,600.  Hospital Course:    Community-acquired pneumonia -Empirically started on Rocephin and azithromycin-O2 needs remain stable and she continued on her baseline nasal cannula oxygen-plan to continue oral antibiotics for 8 days after discharge-chest x-ray this admission with fibrotic changes which if not resolved on follow-up chest x-ray recommended for 7 days after discharge then radiologist recommended noncontrasted CT of the chest to further evaluate; one concern is for possible underlying mass  Chronic respiratory failure with hypoxemia/COPD -Patient remained stable and utilized nebulizers with MDIs during acute hospitalization-she will continue her same preadmission nebulizer and MDIs after discharge  Protein calorie malnutrition -Patient weight 99 pounds this admission with a BMI of 18.2-twice a day Ensure was initiated-patient may benefit from outpatient nutritional evaluation  Hypothyroidism -Continue Synthroid-TSH not checked this admission  Hypertension -Blood pressure well controlled this admission-was not on medications prior to admission therefore suspect this diagnosis longer applies to this patient  History of CVA -Continue aspirin and Plavix; and any statin  Procedures:  None  Consultations:  None  Discharge Exam: Filed Vitals:   04/10/14 0739  BP:   Pulse: 86  Temp:   Resp: 18   Gen: No acute respiratory distress-thin in appearance Chest: Mostly clear to auscultation except for crackles left upper lobe diminished throughout, 2 L Cardiac: Regular rate and rhythm, S1-S2, no rubs murmurs or gallops, no peripheral edema, no JVD Abdomen: Soft nontender nondistended without obvious hepatosplenomegaly, no ascites Extremities: Symmetrical in appearance without cyanosis, clubbing or effusion   Discharge Instructions    Current Discharge Medication List    START taking these medications   Details  acetaminophen (TYLENOL) 325 MG tablet Take 2 tablets (650 mg total) by mouth every 6  (six) hours as needed for mild pain,  fever or headache.    azithromycin (ZITHROMAX) 500 MG tablet Take 1 tablet (500 mg total) by mouth daily. Qty: 8 tablet, Refills: 0    cefUROXime (CEFTIN) 500 MG tablet Take 1 tablet (500 mg total) by mouth 2 (two) times daily with a meal. Qty: 16 tablet, Refills: 0    feeding supplement, ENSURE COMPLETE, (ENSURE COMPLETE) LIQD Take 237 mLs by mouth 2 (two) times daily between meals. Qty: 6 Bottle, Refills: prn      CONTINUE these medications which have NOT CHANGED   Details  albuterol (PROVENTIL) 90 MCG/ACT inhaler Inhale 2 puffs into the lungs every 6 (six) hours as needed.      ALPRAZolam (XANAX) 0.5 MG tablet Take 0.5 mg by mouth at bedtime as needed.      aspirin 81 MG tablet Take 81 mg by mouth daily.    budesonide-formoterol (SYMBICORT) 80-4.5 MCG/ACT inhaler Inhale 2 puffs into the lungs 2 (two) times daily.      calcium-vitamin D (OSCAL) 250-125 MG-UNIT per tablet Take 1 tablet by mouth daily.      Cholecalciferol (VITAMIN D3) 1000 UNITS CAPS Take 1 capsule by mouth daily.    clopidogrel (PLAVIX) 75 MG tablet Take 1 tablet (75 mg total) by mouth daily. Qty: 30 tablet, Refills: 11   Associated Diagnoses: Occlusion and stenosis of carotid artery without mention of cerebral infarction, bilateral; Pre-operative cardiovascular examination    Cyanocobalamin (VITAMIN B-12 PO) Take 1 tablet by mouth as needed (Monday and Thursday).     ferrous sulfate 325 (65 FE) MG EC tablet Take 325 mg by mouth Nightly.     folic acid (FOLVITE) 1 MG tablet Take 1 mg by mouth daily.    HYDROcodone-acetaminophen (VICODIN) 5-500 MG per tablet Take 1 tablet by mouth every 6 (six) hours as needed.      ipratropium (ATROVENT) 0.02 % nebulizer solution Inhale 2 mg into the lungs 4 (four) times daily.      levothyroxine (SYNTHROID, LEVOTHROID) 25 MCG tablet Take 25 mcg by mouth daily.      mometasone (NASONEX) 50 MCG/ACT nasal spray Place 2 sprays into the  nose daily.      pantoprazole (PROTONIX) 40 MG tablet Take 40 mg by mouth daily.    rosuvastatin (CRESTOR) 20 MG tablet Take 10 mg by mouth See admin instructions. Monday and thursday       Allergies  Allergen Reactions  . Fexofenadine Other (See Comments)    Hyper   . Loratadine Other (See Comments)    Hyper   . Pseudoephedrine Other (See Comments)    Hyper   . Morphine And Related Rash   Follow-up Information    Follow up with FULP, CAMMIE, MD.   Specialty:  Family Medicine   Why:  within 1 week after discharge   Contact information:   [place] [id number] [phone]        The results of significant diagnostics from this hospitalization (including imaging, microbiology, ancillary and laboratory) are listed below for reference.    Significant Diagnostic Studies: Dg Chest 2 View  04/08/2014   CLINICAL DATA:  Cough and difficulty breathing; history of COPD and hypertension.  EXAM: CHEST  2 VIEW  COMPARISON:  Jul 23, 2013  FINDINGS: There is underlying emphysema. There is fibrotic change in both upper lobes, more on the left than on the right. In comparison with the prior study, there is increased opacity in the posterior segment of the left upper  lobe.  The heart size is normal. The pulmonary vascularity reflects underlying emphysema. There is old rib trauma bilaterally. There is postoperative change in the cervical spine.  IMPRESSION: There is increased opacity in the posterior segment left upper lobe compared to prior study. Suspect pneumonia superimposed on fibrotic change. Followup study in 7-10 days advised to assess for clearing. If clearing has not occurred during that time, would advise noncontrast enhanced chest CT at that time to assess for the possibility of underlying mass in the posterior segment of the left elsewhere, areas of fibrosis are stable. Underlying emphysema is present. No change in cardiac silhouette.   Electronically Signed    By: Lowella Grip M.D.   On: 04/08/2014 11:46    Microbiology: Recent Results (from the past 240 hour(s))  Culture, blood (routine x 2) Call MD if unable to obtain prior to antibiotics being given     Status: None (Preliminary result)   Collection Time: 04/08/14  8:05 PM  Result Value Ref Range Status   Specimen Description BLOOD LEFT ARM  Final   Special Requests   Final    BOTTLES DRAWN AEROBIC AND ANAEROBIC Morrow AER,8CC ANA   Culture   Final           BLOOD CULTURE RECEIVED NO GROWTH TO DATE CULTURE WILL BE HELD FOR 5 DAYS BEFORE ISSUING A FINAL NEGATIVE REPORT Performed at [facility]    Report Status PENDING  Incomplete  Culture, blood (routine x 2) Call MD if unable to obtain prior to antibiotics being given     Status: None (Preliminary result)   Collection Time: 04/08/14  8:18 PM  Result Value Ref Range Status   Specimen Description BLOOD RIGHT FOREARM  Final   Special Requests BOTTLES DRAWN AEROBIC AND ANAEROBIC 5CC  Final   Culture   Final           BLOOD CULTURE RECEIVED NO GROWTH TO DATE CULTURE WILL BE HELD FOR 5 DAYS BEFORE ISSUING A FINAL NEGATIVE REPORT Performed at [facility]    Report Status PENDING  Incomplete     Labs: Basic Metabolic Panel:  Recent Labs Lab 04/08/14 1055 04/09/14 0349  NA 136 141  K 3.8 4.3  CL 99 104  CO2 28 27  GLUCOSE 126* 164*  BUN 16 15  CREATININE 0.74 0.85  CALCIUM 9.1 9.8   Liver Function Tests: No results for input(s): AST, ALT, ALKPHOS, BILITOT, PROT, ALBUMIN in the last 168 hours. No results for input(s): LIPASE, AMYLASE in the last 168 hours. No results for input(s): AMMONIA in the last 168 hours. CBC:  Recent Labs Lab 04/08/14 1055 04/09/14 0349  WBC 15.6* 9.6  NEUTROABS 13.3*  --   HGB 11.8* 11.5*  HCT 37.3 35.0*  MCV 102.2* 101.4*  PLT 233 280   Cardiac Enzymes:  Recent Labs Lab 04/08/14 1055  TROPONINI <0.03   BNP: BNP (last 3 results) No results for input(s): PROBNP in  the last 8760 hours. CBG:  Recent Labs Lab 04/09/14 1756  GLUCAP 121*       Signed:  ELLIS,ALLISON L. ANP [facility] 04/10/2014, 10:32 AM  Addendum  I personally saw and evaluated patient on 04/10/2014 and agree with the above findings. Patient reporting feeling better and asking to go home, now down to her baseline requirement. On exam she had diminished breath sounds bilaterally, no wheezing, rhonchi or rales. She is mentating well, breathing comfortably. Symptoms likely secondary to COPD exacerbation precipitated by infectious  process. Will discharge her on Ceftin. Patient to follow up with her PCP in 1-2 weeks.

## 2014-04-10 NOTE — Progress Notes (Signed)
Discussed discharge instructions and medications with patient.  Prescriptions given to patient.  All questions answered.

## 2014-04-10 NOTE — Discharge Instructions (Signed)
Azithromycin tablets What is this medicine? AZITHROMYCIN (az ith roe MYE sin) is a macrolide antibiotic. It is used to treat or prevent certain kinds of bacterial infections. It will not work for colds, flu, or other viral infections. This medicine may be used for other purposes; ask your health care provider or pharmacist if you have questions. COMMON BRAND NAME(S): Zithromax, Zithromax Tri-Pak, Zithromax Z-Pak What should I tell my health care provider before I take this medicine? They need to know if you have any of these conditions: -kidney disease -liver disease -irregular heartbeat or heart disease -an unusual or allergic reaction to azithromycin, erythromycin, other macrolide antibiotics, foods, dyes, or preservatives -pregnant or trying to get pregnant -breast-feeding How should I use this medicine? Take this medicine by mouth with a full glass of water. Follow the directions on the prescription label. The tablets can be taken with food or on an empty stomach. If the medicine upsets your stomach, take it with food. Take your medicine at regular intervals. Do not take your medicine more often than directed. Take all of your medicine as directed even if you think your are better. Do not skip doses or stop your medicine early. Talk to your pediatrician regarding the use of this medicine in children. Special care may be needed. Overdosage: If you think you have taken too much of this medicine contact a poison control center or emergency room at once. NOTE: This medicine is only for you. Do not share this medicine with others. What if I miss a dose? If you miss a dose, take it as soon as you can. If it is almost time for your next dose, take only that dose. Do not take double or extra doses. What may interact with this medicine? Do not take this medicine with any of the following medications: -lincomycin This medicine may also interact with the following  medications: -amiodarone -antacids -birth control pills -cyclosporine -digoxin -magnesium -nelfinavir -phenytoin -warfarin This list may not describe all possible interactions. Give your health care provider a list of all the medicines, herbs, non-prescription drugs, or dietary supplements you use. Also tell them if you smoke, drink alcohol, or use illegal drugs. Some items may interact with your medicine. What should I watch for while using this medicine? Tell your doctor or health care professional if your symptoms do not improve. Do not treat diarrhea with over the counter products. Contact your doctor if you have diarrhea that lasts more than 2 days or if it is severe and watery. This medicine can make you more sensitive to the sun. Keep out of the sun. If you cannot avoid being in the sun, wear protective clothing and use sunscreen. Do not use sun lamps or tanning beds/booths. What side effects may I notice from receiving this medicine? Side effects that you should report to your doctor or health care professional as soon as possible: -allergic reactions like skin rash, itching or hives, swelling of the face, lips, or tongue -confusion, nightmares or hallucinations -dark urine -difficulty breathing -hearing loss -irregular heartbeat or chest pain -pain or difficulty passing urine -redness, blistering, peeling or loosening of the skin, including inside the mouth -white patches or sores in the mouth -yellowing of the eyes or skin Side effects that usually do not require medical attention (report to your doctor or health care professional if they continue or are bothersome): -diarrhea -dizziness, drowsiness -headache -stomach upset or vomiting -tooth discoloration -vaginal irritation This list may not describe all possible side effects.  Call your doctor for medical advice about side effects. You may report side effects to FDA at 1-800-FDA-1088. Where should I keep my  medicine? Keep out of the reach of children. Store at room temperature between 15 and 30 degrees C (59 and 86 degrees F). Throw away any unused medicine after the expiration date. NOTE: This sheet is a summary. It may not cover all possible information. If you have questions about this medicine, talk to your doctor, pharmacist, or health care provider.  2015, [employer]. (2012-10-12 15:38:48) Cefuroxime tablets What is this medicine? CEFUROXIME (se fyoor OX eem) is a cephalosporin antibiotic. It is used to treat certain kinds of bacterial infections. It will not work for colds, flu, or other viral infections. This medicine may be used for other purposes; ask your health care provider or pharmacist if you have questions. COMMON BRAND NAME(S): Alti-Cefuroxime, Ceftin What should I tell my health care provider before I take this medicine? They need to know if you have any of these conditions: -bleeding problems -bowel disease, like colitis -kidney disease -liver disease -an unusual or allergic reaction to cefuroxime, other antibiotics or medicines, foods, dyes or preservatives -pregnant or trying to get pregnant -breast-feeding How should I use this medicine? Take this medicine by mouth with a full glass of water. Follow the directions on the prescription label. Do not crush or chew. This medicine works best if you take it with food. Take your medicine at regular intervals. Do not take your medicine more often than directed. Take all of your medicine as directed even if you think your are better. Do not skip doses or stop your medicine early. Talk to your pediatrician regarding the use of this medicine in children. Special care may be needed. While this drug may be prescribed for children as young as 71 months of age for selected conditions, precautions do apply. Overdosage: If you think you have taken too much of this medicine contact a poison control center or emergency room at  once. NOTE: This medicine is only for you. Do not share this medicine with others. What if I miss a dose? If you miss a dose, take it as soon as you can. If it is almost time for your next dose, take only that dose. Do not take double or extra doses. What may interact with this medicine? This medicine may interact with the following medications: -antacids -birth control pills -certain medicines for infection like amikacin, gentamicin, tobramycin -diuretics -probenecid -warfarin This list may not describe all possible interactions. Give your health care provider a list of all the medicines, herbs, non-prescription drugs, or dietary supplements you use. Also tell them if you smoke, drink alcohol, or use illegal drugs. Some items may interact with your medicine. What should I watch for while using this medicine? Tell your doctor or health care professional if your symptoms do not improve or if you get new symptoms. Do not treat diarrhea with over the counter products. Contact your doctor if you have diarrhea that lasts more than 2 days or if it is severe and watery. This medicine can interfere with some urine glucose tests. If you use such tests, talk with your health care professional. If you are being treated for a sexually transmitted disease, avoid sexual contact until you have finished your treatment. Your sexual partner may also need treatment. What side effects may I notice from receiving this medicine? Side effects that you should report to your doctor or health care professional as soon  as possible: -allergic reactions like skin rash, itching or hives, swelling of the face, lips, or tongue -dark urine -difficulty breathing -fever -irregular heartbeat or chest pain -redness, blistering, peeling or loosening of the skin, including inside the mouth -seizures -unusual bleeding or bruising -unusually weak or tired -white patches or sores in the mouth Side effects that usually do not  require medical attention (report to your doctor or health care professional if they continue or are bothersome): -diarrhea -gas or heartburn -headache -nausea, vomiting -vaginal itching This list may not describe all possible side effects. Call your doctor for medical advice about side effects. You may report side effects to FDA at 1-800-FDA-1088. Where should I keep my medicine? Keep out of the reach of children. Store at room temperature between 15 and 30 degrees C (59 and 86 degrees F). Keep container tightly closed. Protect from moisture. Throw away any unused medicine after the expiration date. NOTE: This sheet is a summary. It may not cover all possible information. If you have questions about this medicine, talk to your doctor, pharmacist, or health care provider.  2015, [employer]/Gold Standard. (2013-01-22 09:43:18) Chronic Obstructive Pulmonary Disease Chronic obstructive pulmonary disease (COPD) is a common lung condition in which airflow from the lungs is limited. COPD is a general term that can be used to describe many different lung problems that limit airflow, including both chronic bronchitis and emphysema. If you have COPD, your lung function will probably never return to normal, but there are measures you can take to improve lung function and make yourself feel better.  CAUSES   Smoking (common).   Exposure to secondhand smoke.   Genetic problems.  Chronic inflammatory lung diseases or recurrent infections. SYMPTOMS   Shortness of breath, especially with physical activity.   Deep, persistent (chronic) cough with a large amount of thick mucus.   Wheezing.   Rapid breaths (tachypnea).   Gray or bluish discoloration (cyanosis) of the skin, especially in fingers, toes, or lips.   Fatigue.   Weight loss.   Frequent infections or episodes when breathing symptoms become much worse (exacerbations).   Chest tightness. DIAGNOSIS  Your health care provider  will take a medical history and perform a physical examination to make the initial diagnosis. Additional tests for COPD may include:   Lung (pulmonary) function tests.  Chest X-ray.  CT scan.  Blood tests. TREATMENT  Treatment available to help you feel better when you have COPD includes:   Inhaler and nebulizer medicines. These help manage the symptoms of COPD and make your breathing more comfortable.  Supplemental oxygen. Supplemental oxygen is only helpful if you have a low oxygen level in your blood.   Exercise and physical activity. These are beneficial for nearly all people with COPD. Some people may also benefit from a pulmonary rehabilitation program. HOME CARE INSTRUCTIONS   Take all medicines (inhaled or pills) as directed by your health care provider.  Avoid over-the-counter medicines or cough syrups that dry up your airway (such as antihistamines) and slow down the elimination of secretions unless instructed otherwise by your health care provider.   If you are a smoker, the most important thing that you can do is stop smoking. Continuing to smoke will cause further lung damage and breathing trouble. Ask your health care provider for help with quitting smoking. He or she can direct you to community resources or hospitals that provide support.  Avoid exposure to irritants such as smoke, chemicals, and fumes that aggravate your breathing.  Use oxygen therapy and pulmonary rehabilitation if directed by your health care provider. If you require home oxygen therapy, ask your health care provider whether you should purchase a pulse oximeter to measure your oxygen level at home.   Avoid contact with individuals who have a contagious illness.  Avoid extreme temperature and humidity changes.  Eat healthy foods. Eating smaller, more frequent meals and resting before meals may help you maintain your strength.  Stay active, but balance activity with periods of rest. Exercise and  physical activity will help you maintain your ability to do things you want to do.  Preventing infection and hospitalization is very important when you have COPD. Make sure to receive all the vaccines your health care provider recommends, especially the pneumococcal and influenza vaccines. Ask your health care provider whether you need a pneumonia vaccine.  Learn and use relaxation techniques to manage stress.  Learn and use controlled breathing techniques as directed by your health care provider. Controlled breathing techniques include:   Pursed lip breathing. Start by breathing in (inhaling) through your nose for 1 second. Then, purse your lips as if you were going to whistle and breathe out (exhale) through the pursed lips for 2 seconds.   Diaphragmatic breathing. Start by putting one hand on your abdomen just above your waist. Inhale slowly through your nose. The hand on your abdomen should move out. Then purse your lips and exhale slowly. You should be able to feel the hand on your abdomen moving in as you exhale.   Learn and use controlled coughing to clear mucus from your lungs. Controlled coughing is a series of short, progressive coughs. The steps of controlled coughing are:  1. Lean your head slightly forward.  2. Breathe in deeply using diaphragmatic breathing.  3. Try to hold your breath for 3 seconds.  4. Keep your mouth slightly open while coughing twice.  5. Spit any mucus out into a tissue.  6. Rest and repeat the steps once or twice as needed. SEEK MEDICAL CARE IF:   You are coughing up more mucus than usual.   There is a change in the color or thickness of your mucus.   Your breathing is more labored than usual.   Your breathing is faster than usual.  SEEK IMMEDIATE MEDICAL CARE IF:   You have shortness of breath while you are resting.   You have shortness of breath that prevents you from:  Being able to talk.   Performing your usual physical  activities.   You have chest pain lasting longer than 5 minutes.   Your skin color is more cyanotic than usual.  You measure low oxygen saturations for longer than 5 minutes with a pulse oximeter. MAKE SURE YOU:   Understand these instructions.  Will watch your condition.  Will get help right away if you are not doing well or get worse. Document Released: 12/16/2004 Document Revised: 07/23/2013 Document Reviewed: 11/02/2012 [facility] Patient Information 2015 [place], [place]. This information is not intended to replace advice given to you by your health care provider. Make sure you discuss any questions you have with your health care provider. Pneumonia Pneumonia is an infection of the lungs.  CAUSES Pneumonia may be caused by bacteria or a virus. Usually, these infections are caused by breathing infectious particles into the lungs (respiratory tract). SIGNS AND SYMPTOMS   Cough.  Fever.  Chest pain.  Increased rate of breathing.  Wheezing.  Mucus production. DIAGNOSIS  If you have the common  symptoms of pneumonia, your health care provider will typically confirm the diagnosis with a chest X-ray. The X-ray will show an abnormality in the lung (pulmonary infiltrate) if you have pneumonia. Other tests of your blood, urine, or sputum may be done to find the specific cause of your pneumonia. Your health care provider may also do tests (blood gases or pulse oximetry) to see how well your lungs are working. TREATMENT  Some forms of pneumonia may be spread to other people when you cough or sneeze. You may be asked to wear a mask before and during your exam. Pneumonia that is caused by bacteria is treated with antibiotic medicine. Pneumonia that is caused by the influenza virus may be treated with an antiviral medicine. Most other viral infections must run their course. These infections will not respond to antibiotics.  HOME CARE INSTRUCTIONS   Cough suppressants may be used if you are  losing too much rest. However, coughing protects you by clearing your lungs. You should avoid using cough suppressants if you can.  Your health care provider may have prescribed medicine if he or she thinks your pneumonia is caused by bacteria or influenza. Finish your medicine even if you start to feel better.  Your health care provider may also prescribe an expectorant. This loosens the mucus to be coughed up.  Take medicines only as directed by your health care provider.  Do not smoke. Smoking is a common cause of bronchitis and can contribute to pneumonia. If you are a smoker and continue to smoke, your cough may last several weeks after your pneumonia has cleared.  A cold steam vaporizer or humidifier in your room or home may help loosen mucus.  Coughing is often worse at night. Sleeping in a semi-upright position in a recliner or using a couple pillows under your head will help with this.  Get rest as you feel it is needed. Your body will usually let you know when you need to rest. PREVENTION A pneumococcal shot (vaccine) is available to prevent a common bacterial cause of pneumonia. This is usually suggested for:  People over 36 years old.  Patients on chemotherapy.  People with chronic lung problems, such as bronchitis or emphysema.  People with immune system problems. If you are over 65 or have a high risk condition, you may receive the pneumococcal vaccine if you have not received it before. In some countries, a routine influenza vaccine is also recommended. This vaccine can help prevent some cases of pneumonia.You may be offered the influenza vaccine as part of your care. If you smoke, it is time to quit. You may receive instructions on how to stop smoking. Your health care provider can provide medicines and counseling to help you quit. SEEK MEDICAL CARE IF: You have a fever. SEEK IMMEDIATE MEDICAL CARE IF:   Your illness becomes worse. This is especially true if you are  elderly or weakened from any other disease.  You cannot control your cough with suppressants and are losing sleep.  You begin coughing up blood.  You develop pain which is getting worse or is uncontrolled with medicines.  Any of the symptoms which initially brought you in for treatment are getting worse rather than better.  You develop shortness of breath or chest pain. MAKE SURE YOU:   Understand these instructions.  Will watch your condition.  Will get help right away if you are not doing well or get worse. Document Released: 03/08/2005 Document Revised: 07/23/2013 Document Reviewed: 05/28/2010 [employer]  Patient Information 2015 [place]. This information is not intended to replace advice given to you by your health care provider. Make sure you discuss any questions you have with your health care provider. Acute Bronchitis Bronchitis is inflammation of the airways that extend from the windpipe into the lungs (bronchi). The inflammation often causes mucus to develop. This leads to a cough, which is the most common symptom of bronchitis.  In acute bronchitis, the condition usually develops suddenly and goes away over time, usually in a couple weeks. Smoking, allergies, and asthma can make bronchitis worse. Repeated episodes of bronchitis may cause further lung problems.  CAUSES Acute bronchitis is most often caused by the same virus that causes a cold. The virus can spread from person to person (contagious) through coughing, sneezing, and touching contaminated objects. SIGNS AND SYMPTOMS   Cough.   Fever.   Coughing up mucus.   Body aches.   Chest congestion.   Chills.   Shortness of breath.   Sore throat.  DIAGNOSIS  Acute bronchitis is usually diagnosed through a physical exam. Your health care provider will also ask you questions about your medical history. Tests, such as chest X-rays, are sometimes done to rule out other conditions.  TREATMENT  Acute  bronchitis usually goes away in a couple weeks. Oftentimes, no medical treatment is necessary. Medicines are sometimes given for relief of fever or cough. Antibiotic medicines are usually not needed but may be prescribed in certain situations. In some cases, an inhaler may be recommended to help reduce shortness of breath and control the cough. A cool mist vaporizer may also be used to help thin bronchial secretions and make it easier to clear the chest.  HOME CARE INSTRUCTIONS  Get plenty of rest.   Drink enough fluids to keep your urine clear or pale yellow (unless you have a medical condition that requires fluid restriction). Increasing fluids may help thin your respiratory secretions (sputum) and reduce chest congestion, and it will prevent dehydration.   Take medicines only as directed by your health care provider.  If you were prescribed an antibiotic medicine, finish it all even if you start to feel better.  Avoid smoking and secondhand smoke. Exposure to cigarette smoke or irritating chemicals will make bronchitis worse. If you are a smoker, consider using nicotine gum or skin patches to help control withdrawal symptoms. Quitting smoking will help your lungs heal faster.   Reduce the chances of another bout of acute bronchitis by washing your hands frequently, avoiding people with cold symptoms, and trying not to touch your hands to your mouth, nose, or eyes.   Keep all follow-up visits as directed by your health care provider.  SEEK MEDICAL CARE IF: Your symptoms do not improve after 1 week of treatment.  SEEK IMMEDIATE MEDICAL CARE IF:  You develop an increased fever or chills.   You have chest pain.   You have severe shortness of breath.  You have bloody sputum.   You develop dehydration.  You faint or repeatedly feel like you are going to pass out.  You develop repeated vomiting.  You develop a severe headache. MAKE SURE YOU:   Understand these  instructions.  Will watch your condition.  Will get help right away if you are not doing well or get worse. Document Released: 04/15/2004 Document Revised: 07/23/2013 Document Reviewed: 08/29/2012 [facility] Patient Information 2015 [place], [place]. This information is not intended to replace advice given to you by your health care provider. Make sure  you discuss any questions you have with your health care provider.

## 2014-04-12 LAB — LEGIONELLA ANTIGEN, URINE

## 2014-04-15 LAB — CULTURE, BLOOD (ROUTINE X 2)
CULTURE: NO GROWTH
Culture: NO GROWTH

## 2014-04-19 ENCOUNTER — Other Ambulatory Visit: Payer: Self-pay | Admitting: Family

## 2014-04-19 ENCOUNTER — Ambulatory Visit
Admission: RE | Admit: 2014-04-19 | Discharge: 2014-04-19 | Disposition: A | Discharge status: Home / Self Care | Payer: Medicare Other | Source: Ambulatory Visit | Attending: Family | Admitting: Family

## 2014-04-19 DIAGNOSIS — J189 Pneumonia, unspecified organism: Secondary | ICD-10-CM

## 2014-04-19 DIAGNOSIS — R9389 Abnormal findings on diagnostic imaging of other specified body structures: Secondary | ICD-10-CM

## 2014-08-21 ENCOUNTER — Telehealth: Payer: Self-pay

## 2014-08-21 NOTE — Telephone Encounter (Signed)
Phone call from pt.  Reported she needs approval from Dr. Bridgett Larsson to hold Plavix and ASA prior to dental procedure.  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  Pt. Reported hx of stroke in 2002.  Discussed with Dr. Bridgett Larsson.  Recommended that pt. may hold Plavix and ASA 7 days prior to dental procedure, and to resume ASAP following the procedure.  Will fax note to Dr. Eugene Gavia @ [phone].  Pt. advised of above recommendations per Dr. Bridgett Larsson.

## 2014-08-30 ENCOUNTER — Other Ambulatory Visit (HOSPITAL_COMMUNITY): Payer: Medicare Other

## 2014-08-30 ENCOUNTER — Ambulatory Visit: Payer: Medicare Other | Admitting: Vascular Surgery

## 2014-09-18 ENCOUNTER — Encounter: Payer: Self-pay | Admitting: Vascular Surgery

## 2014-09-20 ENCOUNTER — Other Ambulatory Visit: Payer: Self-pay | Admitting: Family

## 2014-09-20 ENCOUNTER — Encounter: Payer: Self-pay | Admitting: Vascular Surgery

## 2014-09-20 ENCOUNTER — Ambulatory Visit (INDEPENDENT_AMBULATORY_CARE_PROVIDER_SITE_OTHER): Payer: Medicare Other | Admitting: Vascular Surgery

## 2014-09-20 ENCOUNTER — Ambulatory Visit (HOSPITAL_COMMUNITY)
Admission: RE | Admit: 2014-09-20 | Discharge: 2014-09-20 | Disposition: A | Discharge status: Home / Self Care | Payer: Medicare Other | Source: Ambulatory Visit | Attending: Family | Admitting: Family

## 2014-09-20 VITALS — BP 107/68 | HR 68 | Ht 62.0 in | Wt 96.3 lb

## 2014-09-20 DIAGNOSIS — I6523 Occlusion and stenosis of bilateral carotid arteries: Secondary | ICD-10-CM

## 2014-09-20 NOTE — Addendum Note (Signed)
Addended by: Dorthula Rue L on: 09/20/2014 05:08 PM   Modules accepted: Orders

## 2014-09-20 NOTE — Progress Notes (Signed)
Established Carotid Patient  History of Present Illness  Anne Morrow is a 69 y.o. (1945/08/23) female who presents with chief complaint: routine follow-up.  This patient has a distant history of CVA but no recent events.  Previous carotid studies demonstrated: RICA <26% stenosis, LICA <71% stenosis.  Patient has history of TIA x 3: last in 2002. The patient had right hand weakness then right arm weakness then right leg paralysis which resolved. The patient has never had amaurosis fugax or monocular blindness.  The patient has never had facial drooping.  The patient has never had receptive or expressive aphasia.    The patient's PMH, PSH, SH, and FamHx are unchanged from 08/24/13.  Current Outpatient Prescriptions  Medication Sig Dispense Refill  . acetaminophen (TYLENOL) 325 MG tablet Take 2 tablets (650 mg total) by mouth every 6 (six) hours as needed for mild pain, fever or headache.    . albuterol (PROVENTIL) 90 MCG/ACT inhaler Inhale 2 puffs into the lungs every 6 (six) hours as needed.      . ALPRAZolam (XANAX) 0.5 MG tablet Take 0.5 mg by mouth at bedtime as needed.      Marland Kitchen aspirin 81 MG tablet Take 81 mg by mouth daily.    Marland Kitchen azithromycin (ZITHROMAX) 500 MG tablet Take 1 tablet (500 mg total) by mouth daily. 8 tablet 0  . budesonide-formoterol (SYMBICORT) 80-4.5 MCG/ACT inhaler Inhale 2 puffs into the lungs 2 (two) times daily.      . Cholecalciferol (VITAMIN D3) 1000 UNITS CAPS Take 1 capsule by mouth daily.    . clopidogrel (PLAVIX) 75 MG tablet Take 1 tablet (75 mg total) by mouth daily. 30 tablet 11  . Cyanocobalamin (VITAMIN B-12 PO) Take 1 tablet by mouth as needed (Monday and Thursday).     . feeding supplement, ENSURE COMPLETE, (ENSURE COMPLETE) LIQD Take 237 mLs by mouth 2 (two) times daily between meals. 6 Bottle prn  . ferrous sulfate 325 (65 FE) MG EC tablet Take 325 mg by mouth Nightly.     . folic acid (FOLVITE) 1 MG tablet Take 1 mg by mouth daily.    Marland Kitchen  HYDROcodone-acetaminophen (VICODIN) 5-500 MG per tablet Take 1 tablet by mouth every 6 (six) hours as needed.      Marland Kitchen ipratropium (ATROVENT) 0.02 % nebulizer solution Inhale 2 mg into the lungs 4 (four) times daily.      Marland Kitchen levothyroxine (SYNTHROID, LEVOTHROID) 25 MCG tablet Take 25 mcg by mouth daily.      . mometasone (NASONEX) 50 MCG/ACT nasal spray Place 2 sprays into the nose daily.      . pantoprazole (PROTONIX) 40 MG tablet Take 40 mg by mouth daily.    . rosuvastatin (CRESTOR) 20 MG tablet Take 10 mg by mouth See admin instructions. Monday and thursday    . calcium-vitamin D (OSCAL) 250-125 MG-UNIT per tablet Take 1 tablet by mouth daily.      . cefUROXime (CEFTIN) 500 MG tablet Take 1 tablet (500 mg total) by mouth 2 (two) times daily with a meal. (Patient not taking: Reported on 09/20/2014) 16 tablet 0  . doxycycline (VIBRA-TABS) 100 MG tablet     . fluconazole (DIFLUCAN) 150 MG tablet     . HYDROcodone-acetaminophen (NORCO/VICODIN) 5-325 MG per tablet     . predniSONE (DELTASONE) 20 MG tablet     . PROAIR HFA 108 (90 BASE) MCG/ACT inhaler      No current facility-administered medications for this visit.  Allergies  Allergen Reactions  . Fexofenadine Other (See Comments)    Hyper   . Loratadine Other (See Comments)    Hyper   . Pseudoephedrine Other (See Comments)    Hyper   . Morphine And Related Rash    On ROS today: no pulmonary complications, stable COPD, no CVA sx   Physical Examination  Filed Vitals:   09/20/14 1118 09/20/14 1122  BP: 111/68 107/68  Pulse: 68   Height: '5\' 2"'$  (1.575 m)   Weight: 96 lb 4.8 oz (43.681 kg)   SpO2: 98%    Body mass index is 17.61 kg/(m^2).  General: A&O x 3, WD, thin  Eyes: PERRLA, EOMI  Neck: Supple, no nuchal rigidity, no palpable LAD  Pulmonary: Sym exp, good air movt, CTAB, no rales, rhonchi, & wheezing  Cardiac: RRR, Nl S1, S2, no Murmurs, rubs or gallops  Vascular: Vessel Right Left  Radial Palpable Palpable    Brachial Palpable Palpable  Carotid Palpable, without bruit Palpable, without bruit  Aorta Not palpable N/A  Femoral Palpable Palpable  Popliteal Not palpable Not palpable  PT Palpable Faintly Palpable  DP Palpable Not Palpable   Gastrointestinal: soft, NTND, no G/R, no HSM, no masses, no CVAT B  Musculoskeletal: M/S 5/5 throughout , Extremities without ischemic changes   Neurologic: CN 2-12 intact , Pain and light touch intact in extremities , Motor exam as listed above  Non-Invasive Vascular Imaging  CAROTID DUPLEX (Date: 09/20/2014):   R ICA stenosis: 40-59%  R VA: patent and antegrade  L ICA stenosis: <40%  L VA: patent and antegrade   Medical Decision Making  Anne Morrow is a 69 y.o. female who presents with: remote hx of L CVA, B asx ICA stenosis <80%.   Patient's velocities are essentially unchanged.  The right side is on the lower end of that velocity range.  Based on the patient's vascular studies and examination, I have offered the patient: q2 year B carotid duplex.  I discussed in depth with the patient the nature of atherosclerosis, and emphasized the importance of maximal medical management including strict control of blood pressure, blood glucose, and lipid levels, antiplatelet agents, obtaining regular exercise, and cessation of smoking.    The patient is aware that without maximal medical management the underlying atherosclerotic disease process will progress, limiting the benefit of any interventions. The patient is currently on a statin: Crestor. The patient is currently on an anti-platelet: ASA.  Thank you for allowing [place] to participate in this patient's care.   Adele Barthel, MD [facility] Office: [phone] Pager: [phone]  09/20/2014, 11:39 AM

## 2014-10-17 ENCOUNTER — Emergency Department (HOSPITAL_BASED_OUTPATIENT_CLINIC_OR_DEPARTMENT_OTHER)
Admission: EM | Admit: 2014-10-17 | Discharge: 2014-10-17 | Disposition: A | Discharge status: Home / Self Care | Payer: Medicare Other | Attending: Emergency Medicine | Admitting: Emergency Medicine

## 2014-10-17 ENCOUNTER — Emergency Department (HOSPITAL_BASED_OUTPATIENT_CLINIC_OR_DEPARTMENT_OTHER): Payer: Medicare Other

## 2014-10-17 ENCOUNTER — Encounter (HOSPITAL_BASED_OUTPATIENT_CLINIC_OR_DEPARTMENT_OTHER): Payer: Self-pay | Admitting: *Deleted

## 2014-10-17 DIAGNOSIS — Z79899 Other long term (current) drug therapy: Secondary | ICD-10-CM | POA: Diagnosis not present

## 2014-10-17 DIAGNOSIS — Z87891 Personal history of nicotine dependence: Secondary | ICD-10-CM | POA: Diagnosis not present

## 2014-10-17 DIAGNOSIS — J441 Chronic obstructive pulmonary disease with (acute) exacerbation: Secondary | ICD-10-CM

## 2014-10-17 DIAGNOSIS — Z792 Long term (current) use of antibiotics: Secondary | ICD-10-CM | POA: Insufficient documentation

## 2014-10-17 DIAGNOSIS — Z7951 Long term (current) use of inhaled steroids: Secondary | ICD-10-CM | POA: Diagnosis not present

## 2014-10-17 DIAGNOSIS — I10 Essential (primary) hypertension: Secondary | ICD-10-CM | POA: Diagnosis not present

## 2014-10-17 DIAGNOSIS — R0602 Shortness of breath: Secondary | ICD-10-CM | POA: Diagnosis present

## 2014-10-17 DIAGNOSIS — R079 Chest pain, unspecified: Secondary | ICD-10-CM | POA: Insufficient documentation

## 2014-10-17 DIAGNOSIS — E785 Hyperlipidemia, unspecified: Secondary | ICD-10-CM | POA: Insufficient documentation

## 2014-10-17 DIAGNOSIS — Z8673 Personal history of transient ischemic attack (TIA), and cerebral infarction without residual deficits: Secondary | ICD-10-CM | POA: Diagnosis not present

## 2014-10-17 DIAGNOSIS — Z7982 Long term (current) use of aspirin: Secondary | ICD-10-CM | POA: Insufficient documentation

## 2014-10-17 DIAGNOSIS — D649 Anemia, unspecified: Secondary | ICD-10-CM | POA: Insufficient documentation

## 2014-10-17 LAB — CBC WITH DIFFERENTIAL/PLATELET
BASOS ABS: 0 10*3/uL (ref 0.0–0.1)
Basophils Relative: 0 % (ref 0–1)
EOS ABS: 0.3 10*3/uL (ref 0.0–0.7)
Eosinophils Relative: 3 % (ref 0–5)
HCT: 39.9 % (ref 36.0–46.0)
HEMOGLOBIN: 12.6 g/dL (ref 12.0–15.0)
LYMPHS PCT: 28 % (ref 12–46)
Lymphs Abs: 2.5 10*3/uL (ref 0.7–4.0)
MCH: 32.1 pg (ref 26.0–34.0)
MCHC: 31.6 g/dL (ref 30.0–36.0)
MCV: 101.5 fL — AB (ref 78.0–100.0)
MONO ABS: 0.9 10*3/uL (ref 0.1–1.0)
MONOS PCT: 9 % (ref 3–12)
NEUTROS ABS: 5.5 10*3/uL (ref 1.7–7.7)
NEUTROS PCT: 60 % (ref 43–77)
Platelets: 327 10*3/uL (ref 150–400)
RBC: 3.93 MIL/uL (ref 3.87–5.11)
RDW: 12.9 % (ref 11.5–15.5)
WBC: 9.1 10*3/uL (ref 4.0–10.5)

## 2014-10-17 LAB — COMPREHENSIVE METABOLIC PANEL
ALBUMIN: 4.4 g/dL (ref 3.5–5.0)
ALT: 8 U/L — ABNORMAL LOW (ref 14–54)
AST: 20 U/L (ref 15–41)
Alkaline Phosphatase: 51 U/L (ref 38–126)
Anion gap: 8 (ref 5–15)
BUN: 24 mg/dL — ABNORMAL HIGH (ref 6–20)
CALCIUM: 9.4 mg/dL (ref 8.9–10.3)
CO2: 30 mmol/L (ref 22–32)
Chloride: 105 mmol/L (ref 101–111)
Creatinine, Ser: 1.11 mg/dL — ABNORMAL HIGH (ref 0.44–1.00)
GFR calc Af Amer: 57 mL/min — ABNORMAL LOW (ref >60)
GFR calc non Af Amer: 49 mL/min — ABNORMAL LOW (ref >60)
Glucose, Bld: 128 mg/dL — ABNORMAL HIGH (ref 65–99)
POTASSIUM: 3.5 mmol/L (ref 3.5–5.1)
SODIUM: 143 mmol/L (ref 135–145)
Total Bilirubin: 0.3 mg/dL (ref 0.3–1.2)
Total Protein: 7.3 g/dL (ref 6.5–8.1)

## 2014-10-17 LAB — BRAIN NATRIURETIC PEPTIDE: B Natriuretic Peptide: 19 pg/mL (ref 0.0–100.0)

## 2014-10-17 LAB — TROPONIN I

## 2014-10-17 MED ORDER — AZITHROMYCIN 250 MG PO TABS: ORAL_TABLET | ORAL | Status: DC

## 2014-10-17 MED ORDER — ALBUTEROL SULFATE (2.5 MG/3ML) 0.083% IN NEBU
5.0000 mg | INHALATION_SOLUTION | Freq: Once | RESPIRATORY_TRACT | Status: AC
  Administered 2014-10-17: 5 mg via RESPIRATORY_TRACT
  Filled 2014-10-17: qty 6

## 2014-10-17 MED ORDER — HYDROCODONE-ACETAMINOPHEN 5-325 MG PO TABS
1.0000 | ORAL_TABLET | Freq: Once | ORAL | Status: AC
  Administered 2014-10-17: 1 via ORAL
  Filled 2014-10-17: qty 1

## 2014-10-17 MED ORDER — PREDNISONE 20 MG PO TABS
ORAL_TABLET | ORAL | Status: DC
Start: 2014-10-17 — End: 2014-12-09

## 2014-10-17 MED ORDER — ALBUTEROL SULFATE (2.5 MG/3ML) 0.083% IN NEBU
2.5000 mg | INHALATION_SOLUTION | Freq: Once | RESPIRATORY_TRACT | Status: AC
  Administered 2014-10-17: 2.5 mg via RESPIRATORY_TRACT
  Filled 2014-10-17: qty 3

## 2014-10-17 MED ORDER — METHYLPREDNISOLONE SODIUM SUCC 125 MG IJ SOLR
125.0000 mg | INTRAMUSCULAR | Status: AC
  Administered 2014-10-17: 125 mg via INTRAVENOUS
  Filled 2014-10-17: qty 2

## 2014-10-17 MED ORDER — SODIUM CHLORIDE 0.9 % IV BOLUS (SEPSIS)
500.0000 mL | Freq: Once | INTRAVENOUS | Status: AC
  Administered 2014-10-17: 500 mL via INTRAVENOUS

## 2014-10-17 NOTE — ED Notes (Signed)
Harrison, MD at bedside. 

## 2014-10-17 NOTE — ED Notes (Signed)
Patient transported to X-ray 

## 2014-10-17 NOTE — ED Notes (Signed)
SOB. Chest pain. Pt is on home oxygen at 2l/ m [place]. She looks SOB.

## 2014-10-17 NOTE — ED Provider Notes (Signed)
CSN: [id number]     Arrival date & time 10/17/14  1644 History   First MD Initiated Contact with Patient 10/17/14 1655     Chief Complaint  Patient presents with  . Shortness of Breath  . Chest Pain     (Consider location/radiation/quality/duration/timing/severity/associated sxs/prior Treatment) Patient is a 69 y.o. female presenting with shortness of breath and chest pain. The history is provided by the patient.  Shortness of Breath Severity:  Moderate Onset quality:  Gradual Duration:  3 days Timing:  Constant Progression:  Unchanged Chronicity:  New Context: URI   Relieved by: nebulizer treatments. Worsened by:  Nothing tried Ineffective treatments:  None tried Associated symptoms: chest pain and cough   Associated symptoms: no abdominal pain, no fever, no headaches, no neck pain and no vomiting   Chest pain:    Quality:  Pressure   Severity:  Mild   Onset quality:  Gradual   Duration:  3 days   Timing:  Constant   Progression:  Unchanged   Chronicity:  New Chest Pain Associated symptoms: cough and shortness of breath   Associated symptoms: no abdominal pain, no back pain, no dizziness, no fatigue, no fever, no headache, no nausea and not vomiting     Past Medical History  Diagnosis Date  . Hypertension   . COPD (chronic obstructive pulmonary disease)   . Hyperlipemia   . Carotid artery occlusion   . Anemia   . Stroke    Past Surgical History  Procedure Laterality Date  . Back surgery    . Joint replacement    . Abdominal hysterectomy    . Tubal ligation    . Hemorrhoid surgery  1985  . Cataract extraction w/ intraocular lens  implant, bilateral Bilateral     R 1998, L 2000  . Shoulder arthroscopy Left   . Carpal tunnel release     Family History  Problem Relation Age of Onset  . Cancer Mother   . Heart disease Mother   . Hyperlipidemia Mother   . Hypertension Mother   . Heart attack Mother   . Heart disease Father   . Heart attack Father   .  Diabetes Daughter    History  Substance Use Topics  . Smoking status: Former Smoker -- 1.00 packs/day for 60 years    Types: Cigarettes    Quit date: 03/22/2014  . Smokeless tobacco: Never Used     Comment: smoking cessation info given and reviewed  . Alcohol Use: No   OB History    No data available     Review of Systems  Constitutional: Negative for fever and fatigue.  HENT: Negative for congestion and drooling.   Eyes: Negative for pain.  Respiratory: Positive for cough and shortness of breath.   Cardiovascular: Positive for chest pain.  Gastrointestinal: Negative for nausea, vomiting, abdominal pain and diarrhea.  Genitourinary: Negative for dysuria and hematuria.  Musculoskeletal: Negative for back pain, gait problem and neck pain.  Skin: Negative for color change.  Neurological: Negative for dizziness and headaches.  Hematological: Negative for adenopathy.  Psychiatric/Behavioral: Negative for behavioral problems.  All other systems reviewed and are negative.     Allergies  Fexofenadine; Loratadine; Pseudoephedrine; and Morphine and related  Home Medications   Prior to Admission medications   Medication Sig Start Date End Date Taking? Authorizing Provider  acetaminophen (TYLENOL) 325 MG tablet Take 2 tablets (650 mg total) by mouth every 6 (six) hours as needed for mild pain, fever or  headache. 04/10/14   Samella Parr, NP  albuterol (PROVENTIL) 90 MCG/ACT inhaler Inhale 2 puffs into the lungs every 6 (six) hours as needed.      Historical Provider, MD  ALPRAZolam Duanne Moron) 0.5 MG tablet Take 0.5 mg by mouth at bedtime as needed.      Historical Provider, MD  aspirin 81 MG tablet Take 81 mg by mouth daily.    Historical Provider, MD  azithromycin (ZITHROMAX) 500 MG tablet Take 1 tablet (500 mg total) by mouth daily. 04/10/14   Samella Parr, NP  budesonide-formoterol (SYMBICORT) 80-4.5 MCG/ACT inhaler Inhale 2 puffs into the lungs 2 (two) times daily.       Historical Provider, MD  calcium-vitamin D (OSCAL) 250-125 MG-UNIT per tablet Take 1 tablet by mouth daily.      Historical Provider, MD  cefUROXime (CEFTIN) 500 MG tablet Take 1 tablet (500 mg total) by mouth 2 (two) times daily with a meal. Patient not taking: Reported on 09/20/2014 04/10/14   Samella Parr, NP  Cholecalciferol (VITAMIN D3) 1000 UNITS CAPS Take 1 capsule by mouth daily.    Historical Provider, MD  clopidogrel (PLAVIX) 75 MG tablet Take 1 tablet (75 mg total) by mouth daily. 02/02/13   Conrad [place], MD  Cyanocobalamin (VITAMIN B-12 PO) Take 1 tablet by mouth as needed (Monday and Thursday).     Historical Provider, MD  doxycycline (VIBRA-TABS) 100 MG tablet  09/04/14   Historical Provider, MD  feeding supplement, ENSURE COMPLETE, (ENSURE COMPLETE) LIQD Take 237 mLs by mouth 2 (two) times daily between meals. 04/10/14   Samella Parr, NP  ferrous sulfate 325 (65 FE) MG EC tablet Take 325 mg by mouth Nightly.     Historical Provider, MD  fluconazole (DIFLUCAN) 150 MG tablet  07/05/14   Historical Provider, MD  folic acid (FOLVITE) 1 MG tablet Take 1 mg by mouth daily.    Historical Provider, MD  HYDROcodone-acetaminophen (NORCO/VICODIN) 5-325 MG per tablet  07/31/14   Historical Provider, MD  HYDROcodone-acetaminophen (VICODIN) 5-500 MG per tablet Take 1 tablet by mouth every 6 (six) hours as needed.      Historical Provider, MD  ipratropium (ATROVENT) 0.02 % nebulizer solution Inhale 2 mg into the lungs 4 (four) times daily.      Historical Provider, MD  levothyroxine (SYNTHROID, LEVOTHROID) 25 MCG tablet Take 25 mcg by mouth daily.      Historical Provider, MD  mometasone (NASONEX) 50 MCG/ACT nasal spray Place 2 sprays into the nose daily.      Historical Provider, MD  pantoprazole (PROTONIX) 40 MG tablet Take 40 mg by mouth daily.    Historical Provider, MD  predniSONE (DELTASONE) 20 MG tablet  09/04/14   Historical Provider, MD  PROAIR HFA 108 (90 BASE) MCG/ACT inhaler  07/01/14    Historical Provider, MD  rosuvastatin (CRESTOR) 20 MG tablet Take 10 mg by mouth See admin instructions. Monday and thursday    Historical Provider, MD   BP 97/76 mmHg  Pulse 98  Resp 20  Wt 96 lb (43.545 kg)  SpO2 99% Physical Exam  Constitutional: She is oriented to person, place, and time. She appears well-developed and well-nourished.  HENT:  Head: Normocephalic.  Mouth/Throat: Oropharynx is clear and moist. No oropharyngeal exudate.  Eyes: Conjunctivae and EOM are normal. Pupils are equal, round, and reactive to light.  Neck: Normal range of motion. Neck supple.  Cardiovascular: Normal rate, regular rhythm, normal heart sounds and intact distal pulses.  Exam reveals no gallop and no friction rub.   No murmur heard. Pulmonary/Chest: She is in respiratory distress (mild tachypnea). She has wheezes (mild expiratory wheezing bilaterally).  Abdominal: Soft. Bowel sounds are normal. There is no tenderness. There is no rebound and no guarding.  Musculoskeletal: Normal range of motion. She exhibits no edema or tenderness.  Neurological: She is alert and oriented to person, place, and time.  Skin: Skin is warm and dry.  Psychiatric: She has a normal mood and affect. Her behavior is normal.  Nursing note and vitals reviewed.   ED Course  Procedures (including critical care time) Labs Review Labs Reviewed  CBC WITH DIFFERENTIAL/PLATELET - Abnormal; Notable for the following:    MCV 101.5 (*)    All other components within normal limits  COMPREHENSIVE METABOLIC PANEL - Abnormal; Notable for the following:    Glucose, Bld 128 (*)    BUN 24 (*)    Creatinine, Ser 1.11 (*)    ALT 8 (*)    GFR calc non Af Amer 49 (*)    GFR calc Af Amer 57 (*)    All other components within normal limits  TROPONIN I  BRAIN NATRIURETIC PEPTIDE    Imaging Review Dg Chest 2 View  10/17/2014   CLINICAL DATA:  Dyspnea and chest pain  EXAM: CHEST  2 VIEW  COMPARISON:  04/19/2014  FINDINGS: There is  hyperinflation. Emphysematous and fibrotic changes are present in the apices with moderate upper lobe volume loss. There is a recent appearing fracture of the right third rib, not evident on the 04/19/2014 study. Remote healed fracture deformities are present about several other ribs bilaterally.  There is no confluent airspace opacity. There is no effusion. The vasculature and interstitium appear mildly prominent, particularly on comparison to the prior study. This may represent a degree of congestive failure with mild interstitial edema but no frank alveolar edema. Heart size is unchanged. Hilar and mediastinal contours are unremarkable and unchanged.  IMPRESSION: 1. Moderate vascular and interstitial prominence, new from 04/19/2014. This may represent a degree of congestive heart failure superimposed on severe COPD. 2. Marked hyperinflation with severe emphysematous and fibrotic changes. 3. Right third rib fracture which is not necessarily acute but it does appear to be recent and it is new from 04/19/2014. Numerous old or remote fracture deformities bilaterally.   Electronically Signed   By: Andreas Newport M.D.   On: 10/17/2014 17:53     EKG Interpretation   Date/Time:  Thursday October 17 2014 16:51:17 EDT Ventricular Rate:  97 PR Interval:  148 QRS Duration: 88 QT Interval:  384 QTC Calculation: 487 R Axis:   90 Text Interpretation:  Sinus rhythm with occasional Premature ventricular  complexes Rightward axis Anterior infarct , age undetermined Confirmed by  Franchon Ketterman  MD, Hennessey Cantrell ([id number]) on 10/17/2014 4:56:39 PM      MDM   Final diagnoses:  SOB (shortness of breath)  COPD exacerbation    5:21 PM 69 y.o. female w hx of HTN, COPD, HLP, CVA on plavix who presents with cough productive of white sputum and shortness of breath over the last 3 days. Cough has been ongoing for about one week but has worsened in the last few days. She also notes constant chest pressure on the right side  radiating to her back over the last 3 days. Seems to be worse with coughing. She denies any fevers, vomiting, or diarrhea. She is on 2 L via nasal cannula at baseline. Vital signs  unremarkable here. She got a breathing treatment prior to my evaluation and has mild expiratory wheezing on my exam. She typically uses a ABIs are machine twice a day but has been using it more often. Suspect a COPD exacerbation. We'll get screening labs and imaging.  7:13 PM: CXR w/ possible chf, the patient is not fluid overloaded on exam, normal BNP, normal troponin. She is feeling better after albuterol treatments here. Suspect COPD exacerbation. Chest pain likely related to subacute right-sided rib fracture. Patient has had rib fractures in the past from coughing. She denies any falls. We'll give incentive spirometer. She requested a cheap anti-biotic. Azithromycin should suffice. I offered admission but the patient would prefer to go home. She has pain meds at home and does not need a refill. She is stable on her home oxygen here. I have discussed the diagnosis/risks/treatment options with the patient and family and believe the pt to be eligible for discharge home to follow-up with her pcp. We also discussed returning to the ED immediately if new or worsening sx occur. We discussed the sx which are most concerning (e.g., worsening sob, cp, fever) that necessitate immediate return. Medications administered to the patient during their visit and any new prescriptions provided to the patient are listed below.  Medications given during this visit Medications  albuterol (PROVENTIL) (2.5 MG/3ML) 0.083% nebulizer solution 5 mg (5 mg Nebulization Given 10/17/14 1705)  methylPREDNISolone sodium succinate (SOLU-MEDROL) 125 mg/2 mL injection 125 mg (125 mg Intravenous Given 10/17/14 1734)  sodium chloride 0.9 % bolus 500 mL (0 mLs Intravenous Stopped 10/17/14 1757)  HYDROcodone-acetaminophen (NORCO/VICODIN) 5-325 MG per tablet 1 tablet (1  tablet Oral Given 10/17/14 1733)  albuterol (PROVENTIL) (2.5 MG/3ML) 0.083% nebulizer solution 2.5 mg (2.5 mg Nebulization Given 10/17/14 1752)    New Prescriptions   AZITHROMYCIN (ZITHROMAX Z-PAK) 250 MG TABLET    2 po day one, then 1 daily x 4 days   PREDNISONE (DELTASONE) 20 MG TABLET    Take 2 tablets by mouth on day 1 and 2. Take 1 tablet by mouth on day 3 and 4.     Pamella Pert, MD 10/17/14 [phone]

## 2014-11-06 ENCOUNTER — Institutional Professional Consult (permissible substitution): Payer: Medicare Other | Admitting: Internal Medicine

## 2014-12-09 ENCOUNTER — Ambulatory Visit (INDEPENDENT_AMBULATORY_CARE_PROVIDER_SITE_OTHER): Payer: Medicare Other | Admitting: Internal Medicine

## 2014-12-09 ENCOUNTER — Ambulatory Visit (INDEPENDENT_AMBULATORY_CARE_PROVIDER_SITE_OTHER)
Admission: RE | Admit: 2014-12-09 | Discharge: 2014-12-09 | Disposition: A | Discharge status: Home / Self Care | Payer: Medicare Other | Source: Ambulatory Visit | Attending: Internal Medicine | Admitting: Internal Medicine

## 2014-12-09 ENCOUNTER — Encounter: Payer: Self-pay | Admitting: Internal Medicine

## 2014-12-09 VITALS — BP 110/64 | HR 88 | Ht 64.0 in | Wt 93.4 lb

## 2014-12-09 DIAGNOSIS — R05 Cough: Secondary | ICD-10-CM | POA: Insufficient documentation

## 2014-12-09 DIAGNOSIS — R918 Other nonspecific abnormal finding of lung field: Secondary | ICD-10-CM | POA: Diagnosis not present

## 2014-12-09 DIAGNOSIS — Z720 Tobacco use: Secondary | ICD-10-CM

## 2014-12-09 DIAGNOSIS — J9611 Chronic respiratory failure with hypoxia: Secondary | ICD-10-CM

## 2014-12-09 DIAGNOSIS — R059 Cough, unspecified: Secondary | ICD-10-CM | POA: Insufficient documentation

## 2014-12-09 DIAGNOSIS — F17210 Nicotine dependence, cigarettes, uncomplicated: Secondary | ICD-10-CM

## 2014-12-09 DIAGNOSIS — J449 Chronic obstructive pulmonary disease, unspecified: Secondary | ICD-10-CM

## 2014-12-09 MED ORDER — FLUCONAZOLE 100 MG PO TABS: 100.0000 mg | ORAL_TABLET | Freq: Every day | ORAL | Status: DC

## 2014-12-09 MED ORDER — AMOXICILLIN-POT CLAVULANATE 875-125 MG PO TABS: 1.0000 | ORAL_TABLET | Freq: Two times a day (BID) | ORAL | Status: DC

## 2014-12-09 NOTE — Patient Instructions (Addendum)
Augmentin 875 mg take one pill twice daily  X 10 days - take at breakfast and supper with large glass of water.  It would help reduce the usual side effects (diarrhea and yeast infections) if you ate cultured yogurt at lunch.   Please remember to go to the  x-ray department downstairs for your tests - we will call you with the results when they are available.  The key is to stop smoking completely before smoking completely stops you!   Take delsym two tsp every 12 hours and supplement if needed with  vicodin up to 2 every 4 hours to suppress the urge to cough. Swallowing water or using ice chips/non mint and menthol containing candies (such as lifesavers or sugarless jolly ranchers) are also effective.  You should rest your voice and avoid activities that you know make you cough.  NO COUGH DROPS Once you have eliminated the cough for 3 straight days try reducing the vicodin first,  then the delsym as tolerated.     Please schedule a follow up office visit in 2 weeks, sooner if needed

## 2014-12-09 NOTE — Progress Notes (Signed)
Subjective:    Patient ID: Anne Morrow, female    DOB: 12/18/1945,    MRN: [id number]  HPI  21 yowf active smoker  On and off 02 since 2002 but now on it 24/7 and on 02 can stil do a slow mart WM walk leaning on cart then  acutely worse with cough / sob/ elephant chest > admit with aecopd in [place] July 2016 with abn CT in [facility] hospital and persistent cough/   Walking more slowly than baseline and variable doe esp bad congestion in am with greenish tint referred to pulmonary clinic 12/09/2014 for eval of CT chest with MPN's   12/09/2014 1st [facility] office visit/ [facility]   Chief Complaint  Patient presents with  . Advice Only    refer [name] for pulm nodule.   not back to baseline doe, broke rib from coughing. Very poor dentition   No obvious   day to day or daytime variabilty or assoc  chest tightness, subjective wheeze overt sinus or hb symptoms. No unusual exp hx or h/o childhood pna/ asthma or knowledge of premature birth.  Sleeping ok without nocturnal  or early am exacerbation  of respiratory  c/o's or need for noct saba. Also denies any obvious fluctuation of symptoms with weather or environmental changes or other aggravating or alleviating factors except as outlined above   Current Medications, Allergies, Complete Past Medical History, Past Surgical History, Family History, and Social History were reviewed in [employer] record.              Review of Systems  Constitutional: Positive for appetite change and unexpected weight change. Negative for fever.  HENT: Positive for congestion and dental problem. Negative for ear pain, nosebleeds, postnasal drip, rhinorrhea, sinus pressure, sneezing, sore throat and trouble swallowing.   Eyes: Negative for redness and itching.  Respiratory: Positive for cough and shortness of breath. Negative for chest tightness and wheezing.   Cardiovascular: Negative for palpitations and leg swelling.    Gastrointestinal: Negative for nausea and vomiting.  Genitourinary: Negative for dysuria.  Musculoskeletal: Positive for arthralgias. Negative for joint swelling.  Skin: Negative for rash.  Neurological: Negative for headaches.  Hematological: Does not bruise/bleed easily.  Psychiatric/Behavioral: Negative for dysphoric mood. The patient is nervous/anxious.        Objective:   Physical Exam  Thin chronically ill frail wf nad  Wt Readings from Last 3 Encounters:  12/09/14 93 lb 6.4 oz (42.366 kg)  10/17/14 96 lb (43.545 kg)  09/20/14 96 lb 4.8 oz (43.681 kg)    Vital signs reviewed   HEENT: Poor  dentition, turbinates, and orophanx. Nl external ear canals without cough reflex   NECK :  without JVD/Nodes/TM/ nl carotid upstrokes bilaterally   LUNGS: no acc muscle use, insp and exp junky ronchi bilaterally    CV:  RRR  no s3 or murmur or increase in P2, no edema   ABD:  soft and nontender with nl excursion in the supine position. No bruits or organomegaly, bowel sounds nl  MS:  warm without deformities, calf tenderness, cyanosis or clubbing  SKIN: warm and dry without lesions    NEURO:  alert, approp, no deficits      I personally reviewed images and agree with radiology impression as follows:  CXR:   12/09/2014  Stable 2.6 cm left upper lobe pulmonary nodule. Right upper lobe pulmonary nodule is not well delineated on the current exam. Stable left lower lobe  pulmonary nodule measuring 2.6 cm.         Assessment & Plan:

## 2014-12-11 ENCOUNTER — Encounter: Payer: Self-pay | Admitting: Internal Medicine

## 2014-12-11 DIAGNOSIS — F17210 Nicotine dependence, cigarettes, uncomplicated: Secondary | ICD-10-CM | POA: Insufficient documentation

## 2014-12-11 NOTE — Assessment & Plan Note (Signed)
Continue symbicort for now, will need pfts eventually but needs to focus for now on smoking cessation > see sep a/p

## 2014-12-11 NOTE — Assessment & Plan Note (Signed)
FNA 03/31/10 LLL nodule> necrosis and granulomatous changes, no ca Baseline cxr 04/09/14 no nodules CXR 10/17/14 3 nodules clearly viz cxr 12/09/2014  2 nodules viz/ the LUL much more organized a distinct since 10/17/14   She tells me she has already undergone RT and chemo for presumptive dx of lung ca and would never do it again - if this is lung ca, she wasn't a candidate for resection before and clearly not now so best hope is that these are inflammatory/ infectious ? Related to bad teeth?   rec .Augmentin 875 mg take one pill twice daily  X 10 days  Then ov in 2 weeks to regroup.  Discussed in detail all the  indications, usual  risks and alternatives  relative to the benefits with patient who agrees to proceed with conservative f/u as outlined

## 2014-12-11 NOTE — Assessment & Plan Note (Signed)
Control with delsym and vicodin > See instructions for specific recommendations which were reviewed directly with the patient who was given a copy with highlighter outlining the key components.

## 2014-12-11 NOTE — Assessment & Plan Note (Signed)

## 2014-12-11 NOTE — Assessment & Plan Note (Signed)
Adequate control on present rx, reviewed > no change in rx needed   

## 2014-12-23 ENCOUNTER — Ambulatory Visit (INDEPENDENT_AMBULATORY_CARE_PROVIDER_SITE_OTHER): Payer: Medicare Other | Admitting: Internal Medicine

## 2014-12-23 ENCOUNTER — Encounter: Payer: Self-pay | Admitting: Internal Medicine

## 2014-12-23 VITALS — BP 112/60 | HR 105 | Ht 62.0 in | Wt 94.4 lb

## 2014-12-23 DIAGNOSIS — Z23 Encounter for immunization: Secondary | ICD-10-CM | POA: Diagnosis not present

## 2014-12-23 DIAGNOSIS — J9611 Chronic respiratory failure with hypoxia: Secondary | ICD-10-CM

## 2014-12-23 DIAGNOSIS — R918 Other nonspecific abnormal finding of lung field: Secondary | ICD-10-CM | POA: Diagnosis not present

## 2014-12-23 DIAGNOSIS — F17210 Nicotine dependence, cigarettes, uncomplicated: Secondary | ICD-10-CM

## 2014-12-23 DIAGNOSIS — J449 Chronic obstructive pulmonary disease, unspecified: Secondary | ICD-10-CM

## 2014-12-23 IMAGING — US US CAROTID DUPLEX BILAT
1 series · 13 of 24 positions shown · non-contrast
Comparison: None.

CLINICAL DATA: History of CVA, hypertension, smoking, history of
lung cancer, initial encounter.

BILATERAL CAROTID DUPLEX ULTRASOUND
TECHNIQUE: Gray scale imaging, color Doppler and duplex ultrasound
were performed of bilateral carotid and vertebral arteries in the
neck.

[Series 1: us carotid duplex bilat · 0.09mm/px · 13 of 60 slices shown]
[im 1/60]
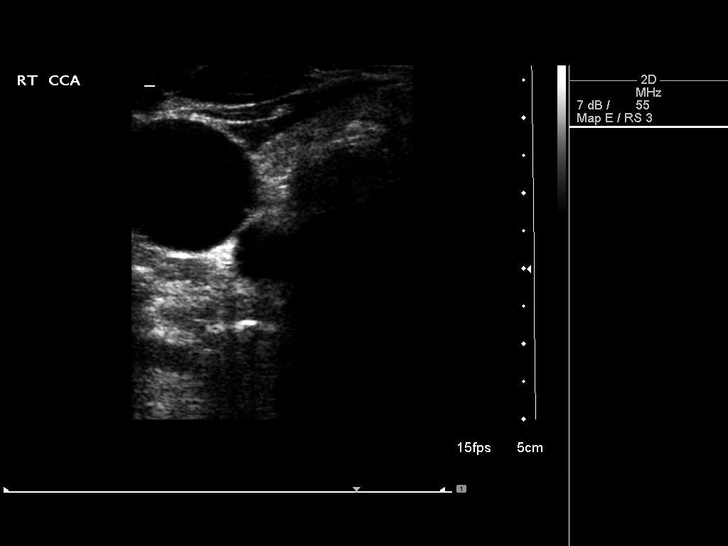
[im 6/60]
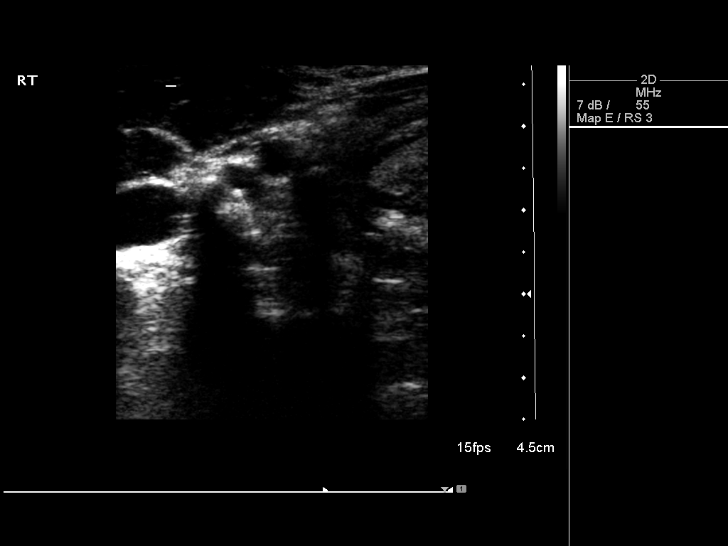
[im 11/60]
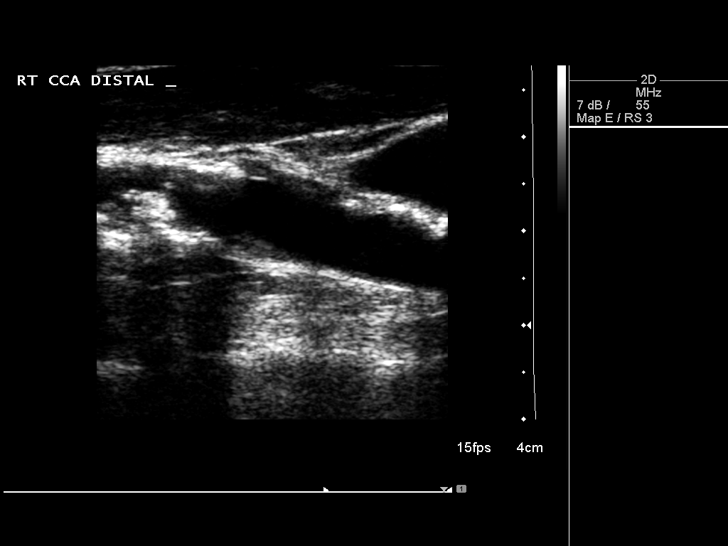
[im 16/60]
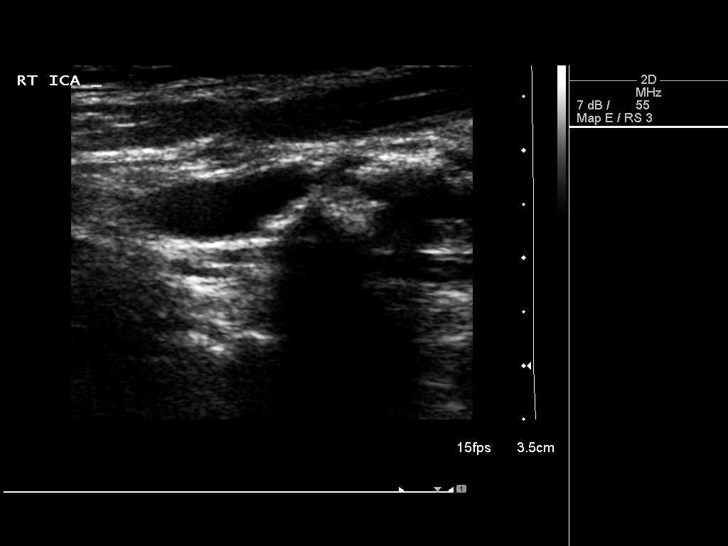
[im 21/60]
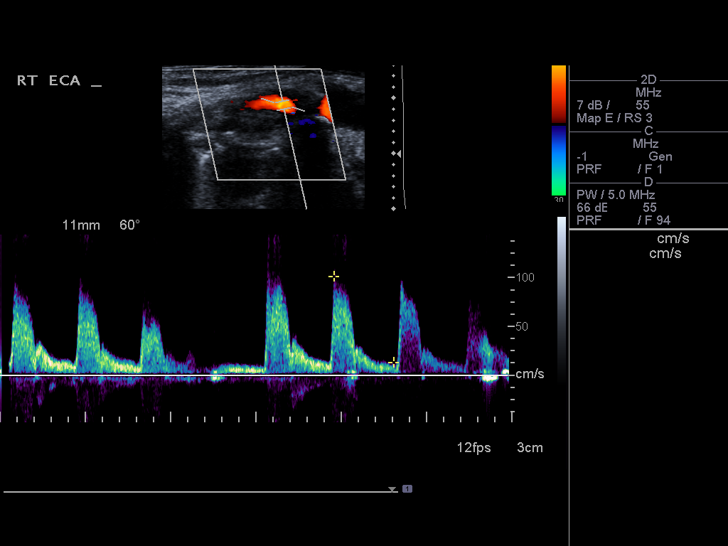
[im 26/60]
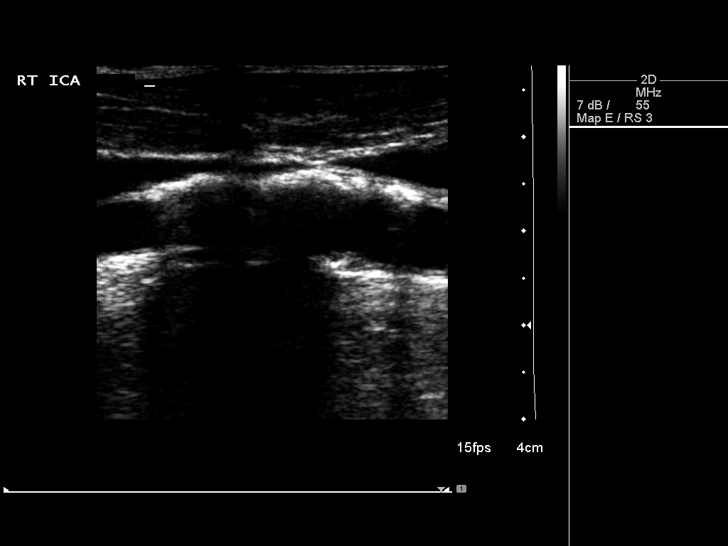
[im 31/60]
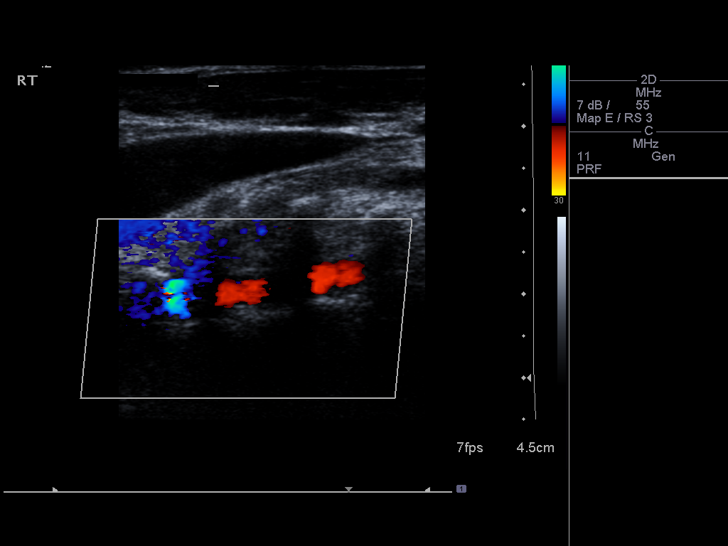
[im 34/60]
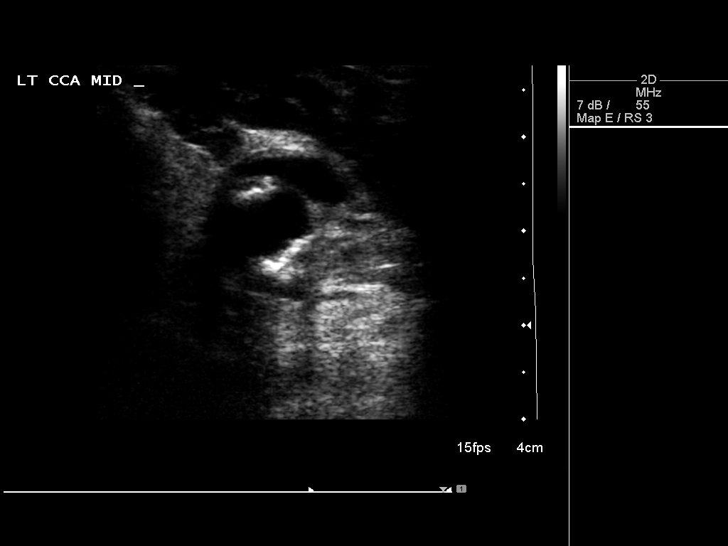
[im 39/60]
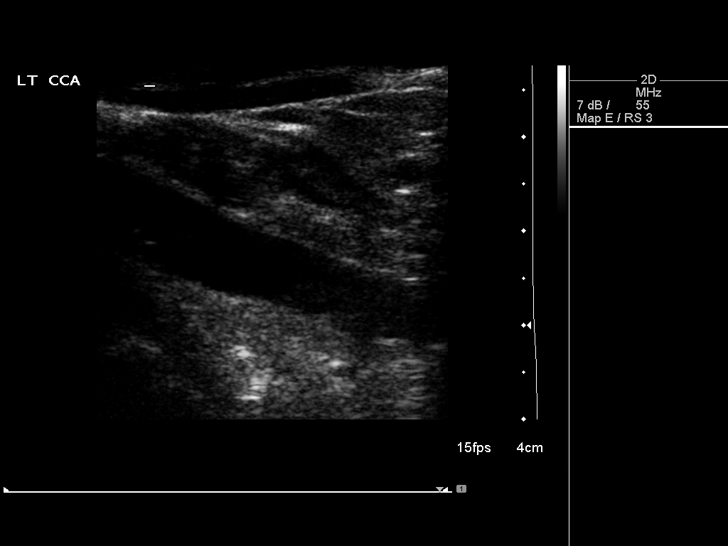
[im 44/60]
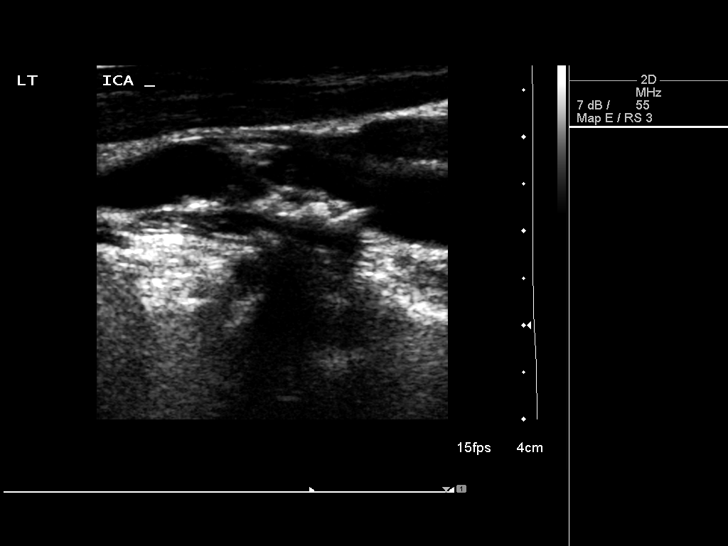
[im 49/60]
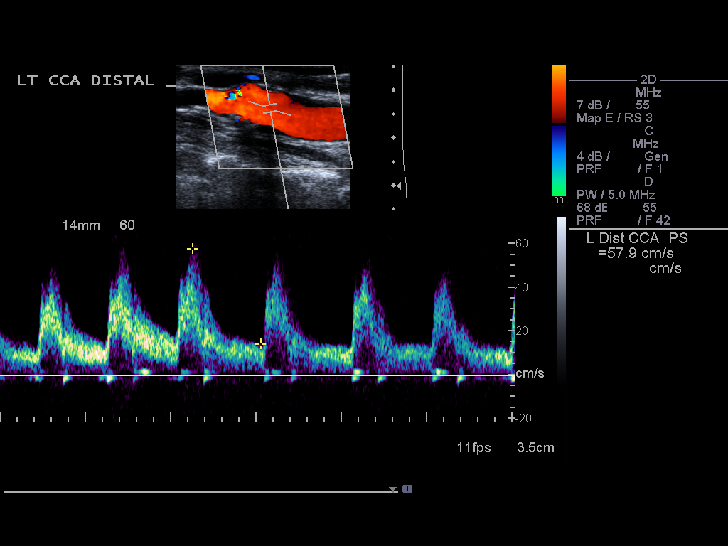
[im 54/60]
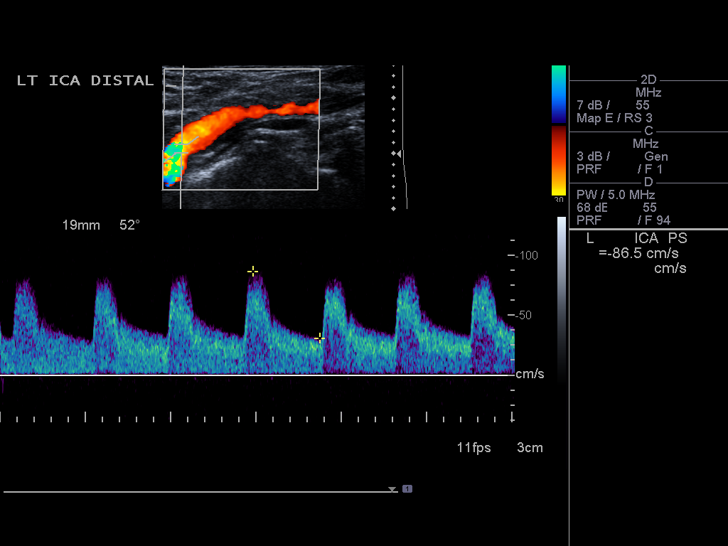
[im 60/60]
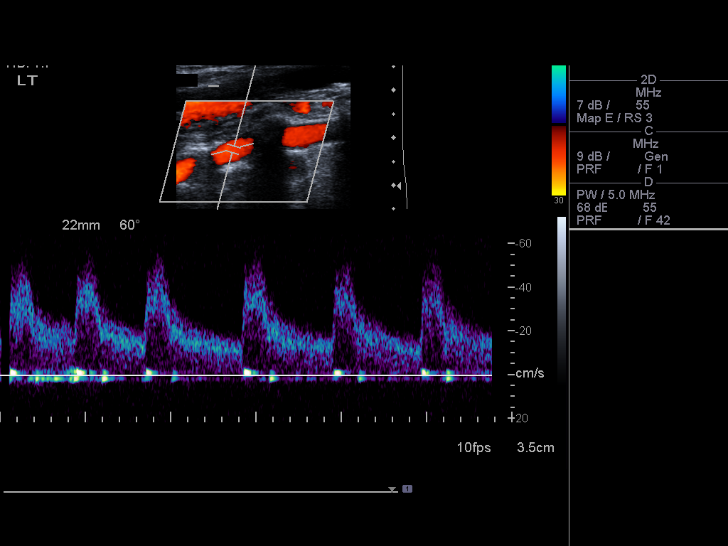

[13 of 24 positions shown; findings below may reference images not displayed]

Criteria:  Quantification of carotid stenosis is based on velocity
parameters that correlate the residual internal carotid diameter
with NASCET-based stenosis levels, using the diameter of the distal
internal carotid lumen as the denominator for stenosis measurement.

The following velocity measurements were obtained:

                 PEAK SYSTOLIC/END DIASTOLIC
RIGHT
ICA:                        88/25cm/sec
CCA:                        66/10cm/sec
SYSTOLIC ICA/CCA RATIO:
DIASTOLIC ICA/CCA RATIO:
ECA:                        101cm/sec

LEFT
ICA:                        87/31cm/sec
CCA:                        63/15cm/sec
SYSTOLIC ICA/CCA RATIO:
DIASTOLIC ICA/CCA RATIO:
ECA:                        90cm/sec
FINDINGS: RIGHT CAROTID ARTERY: There is eccentric mixed echogenic partially
shadowing plaque within the mid aspect the right common carotid
artery (images for and 11).  There is eccentric mixed echogenic
partially shadowing plaque within the right carotid bulb (images 5
and 14) extending to involve the origin and proximal aspect of the
right internal carotid artery (images 15 - 18), not resulting in
elevated peak systolic velocities within the interrogated course of
the right internal carotid artery to suggest a hemodynamically
significant stenosis.

RIGHT VERTEBRAL ARTERY:  Antegrade flow

LEFT CAROTID ARTERY: There is eccentric echogenic partially
shadowing plaque within the mid and distal aspects of the left
common carotid artery (images 36, 37, 42 and 43).  There is
eccentric mixed echogenic partially shadowing plaque within the
left carotid bulb (image 38) extending to involve the origin and
proximal aspect of the left internal carotid artery (images 46 and
48), not resulting elevated peak systolic velocities within the
left internal carotid artery suggest a hemodynamically significant
narrowing.

LEFT VERTEBRAL ARTERY:  Antegrade flow
IMPRESSION: Rather extensive amount of bilateral atherosclerotic plaque
bilaterally, not resulting in elevated peak systolic velocities
within either internal carotid artery to suggest a hemodynamically
significant stenosis. Note, atherosclerotic plaque may serve as a
source of embolism.  Further evaluation may be performed with CTA
as clinically indicated.

## 2014-12-23 NOTE — Patient Instructions (Addendum)
Plan A = Automatic = Symbicort 80 Take 2 puffs first thing in am and then another 2 puffs about 12 hours later.   Work on inhaler technique:  relax and gently blow all the way out then take a nice smooth deep breath back in, triggering the inhaler at same time you start breathing in.  Hold for up to 5 seconds if you can. Blow out thru nose. Rinse and gargle with water when done    Plan B=  Backup  Only use your albuterol (proair) as a rescue medication to be used if you can't catch your breath by resting or doing a relaxed purse lip breathing pattern.  - The less you use it, the better it will work when you need it. - Ok to use up to 2 puffs  every 4 hours if you must but call for immediate appointment if use goes up over your usual need - Don't leave home without it !!  (think of it like the spare tire for your car)   Plan C = Crisis - only use the nebulizer if you use plan B first and it doesn't work  Plan D = Doctor, call if nebulizer use is going up   Plan E = ER, go there if all else fails  The key is to stop smoking completely before smoking completely stops you!   Please schedule a follow up office visit in 4 weeks, sooner if needed with cxr and pfts

## 2014-12-23 NOTE — Progress Notes (Signed)
Subjective:    Patient ID: Anne Morrow, female    DOB: 03/26/1945,    MRN: [id number]    Brief patient profile:  59 yowf active smoker  On and off 02 since 2002 but now on it 24/7 and on 02 can stil do a slow mart [place] walk leaning on cart then  acutely worse with cough / sob/ elephant chest > admit with aecopd in [place] July 2016 with abn CT in [facility] hospital and persistent cough/   Walking more slowly than baseline and variable doe esp bad congestion in am with greenish tint referred to pulmonary clinic 12/09/2014 for eval of CT chest with MPN's   12/09/2014 1st [facility] office visit/ Wert   Chief Complaint  Patient presents with  . Advice Only    refer [name] for pulm nodule.   not back to baseline doe, broke rib from coughing. Very poor dentition  rec Augmentin 875 mg take one pill twice daily  X 10 days - .  Please remember to go to the  x-ray department downstairs for your tests - we will call you with the results when they are available. The key is to stop smoking completely before smoking completely stops you!  Take delsym two tsp every 12 hours and supplement if needed with  vicodin up to 2 every 4 hours Once you have eliminated the cough for 3 straight days try reducing the vicodin first,  then the delsym as tolerated.     12/23/2014  f/u ov/Wert re: ? Lung abscess related to poor dentition/ smoker Chief Complaint  Patient presents with  . Follow-up    Pt states her breathing has improved back to her normal baseline. She is coughing less and sputum is clear. No new co's today. She is using albuterol inhaler 1-2 x daily and neb with albuterol 2 x daily on average.   no money for dental care , still smoking Able to shop at [employer] on 02 pushing cart  Taking way too much saba "because I was told to" - very poor hfa  No obvious day to day or daytime variability or assoc chronic cough or cp or chest tightness, subjective wheeze or overt sinus or hb symptoms. No unusual exp hx  or h/o childhood pna/ asthma or knowledge of premature birth.  Sleeping ok without nocturnal  or early am exacerbation  of respiratory  c/o's or need for noct saba. Also denies any obvious fluctuation of symptoms with weather or environmental changes or other aggravating or alleviating factors except as outlined above   Current Medications, Allergies, Complete Past Medical History, Past Surgical History, Family History, and Social History were reviewed in [employer] record.  ROS  The following are not active complaints unless bolded sore throat, dysphagia, dental problems, itching, sneezing,  nasal congestion or excess/ purulent secretions, ear ache,   fever, chills, sweats, unintended wt loss, classically pleuritic or exertional cp, hemoptysis,  orthopnea pnd or leg swelling, presyncope, palpitations, abdominal pain, anorexia, nausea, vomiting, diarrhea  or change in bowel or bladder habits, change in stools or urine, dysuria,hematuria,  rash, arthralgias, visual complaints, headache, numbness, weakness or ataxia or problems with walking or coordination,  change in mood/affect or memory.                          Objective:   Physical Exam  Thin chronically ill frail wf nad   12/23/2014  94  Wt Readings from Last 3 Encounters:  12/09/14 93 lb 6.4 oz (42.366 kg)  10/17/14 96 lb (43.545 kg)  09/20/14 96 lb 4.8 oz (43.681 kg)    Vital signs reviewed   HEENT: Poor dentition, turbinates, and orophanx. Nl external ear canals without cough reflex   NECK :  without JVD/Nodes/TM/ nl carotid upstrokes bilaterally   LUNGS: no acc muscle use,  Minimal insp and exp rhonchi bilaterally    CV:  RRR  no s3 or murmur or increase in P2, no edema   ABD:  soft and nontender with nl excursion in the supine position. No bruits or organomegaly, bowel sounds nl  MS:  warm without deformities, calf tenderness, cyanosis or clubbing  SKIN: warm and dry without  lesions    NEURO:  alert, approp, no deficits      I personally reviewed images and agree with radiology impression as follows:  CXR:   12/09/2014  Stable 2.6 cm left upper lobe pulmonary nodule. Right upper lobe pulmonary nodule is not well delineated on the current exam. Stable left lower lobe pulmonary nodule measuring 2.6 cm.         Assessment & Plan:

## 2014-12-29 ENCOUNTER — Encounter: Payer: Self-pay | Admitting: Internal Medicine

## 2014-12-29 NOTE — Assessment & Plan Note (Signed)
Adequate control on present rx, reviewed > no change in rx needed  = 2lpm 24/7  

## 2014-12-29 NOTE — Assessment & Plan Note (Addendum)
Confused over details of care and still smoking/ needs f/u pfts > scheduled  The proper method of use, as well as anticipated side effects, of a metered-dose inhaler are discussed and demonstrated to the patient. Improved effectiveness after extensive coaching during this visit to a level of approximately  75%   I had an extended discussion with the patient reviewing all relevant studies completed to date and  lasting 15 to 20 minutes of a 25 minute visit    Each maintenance medication was reviewed in detail including most importantly the difference between maintenance and prns and under what circumstances the prns are to be triggered using an action plan format that is not reflected in the computer generated alphabetically organized AVS.    Please see instructions for details which were reviewed in writing and the patient given a copy highlighting the part that I personally wrote and discussed at today's ov.

## 2014-12-29 NOTE — Assessment & Plan Note (Addendum)
FNA 03/31/10 LLL nodule> necrosis and granulomatous changes, no ca Baseline cxr 04/09/14 no nodules CXR 10/17/14 3 nodules clearly viz CT chest 10/19/14 [facility] new 1.8 / 2cm LLL nodule, no change RUL and LUL post nodules  BS 10/21/14 [place] : neg met dz cxr 12/09/2014  2 nodules viz/ the LUL much more organized a distinct since 10/17/14   Still same ddx between lung abscess and lung ca and at this point refuses bx or consideration for oncology eval (been there before)  Discussed in detail all the  indications, usual  risks and alternatives  relative to the benefits with patient who agrees to proceed with conservative f/u as outlined

## 2014-12-29 NOTE — Assessment & Plan Note (Signed)
>   3 m  I took an extended  opportunity with this patient to outline the consequences of continued cigarette use  in airway disorders based on all the data we have from the multiple national lung health studies (perfomed over decades at millions of dollars in cost)  indicating that smoking cessation, not choice of inhalers or physicians, is the most important aspect of care.    Not yet ready to commit to quit

## 2015-02-04 ENCOUNTER — Encounter: Payer: Self-pay | Admitting: Internal Medicine

## 2015-02-04 ENCOUNTER — Ambulatory Visit (INDEPENDENT_AMBULATORY_CARE_PROVIDER_SITE_OTHER): Payer: Medicare Other | Admitting: Internal Medicine

## 2015-02-04 ENCOUNTER — Telehealth: Payer: Self-pay | Admitting: Internal Medicine

## 2015-02-04 ENCOUNTER — Ambulatory Visit (INDEPENDENT_AMBULATORY_CARE_PROVIDER_SITE_OTHER)
Admission: RE | Admit: 2015-02-04 | Discharge: 2015-02-04 | Disposition: A | Discharge status: Home / Self Care | Payer: Medicare Other | Source: Ambulatory Visit | Attending: Internal Medicine | Admitting: Internal Medicine

## 2015-02-04 VITALS — BP 102/60 | HR 102 | Ht 62.0 in | Wt 88.0 lb

## 2015-02-04 DIAGNOSIS — R918 Other nonspecific abnormal finding of lung field: Secondary | ICD-10-CM

## 2015-02-04 DIAGNOSIS — Z720 Tobacco use: Secondary | ICD-10-CM

## 2015-02-04 DIAGNOSIS — F17210 Nicotine dependence, cigarettes, uncomplicated: Secondary | ICD-10-CM

## 2015-02-04 DIAGNOSIS — J9611 Chronic respiratory failure with hypoxia: Secondary | ICD-10-CM

## 2015-02-04 DIAGNOSIS — J449 Chronic obstructive pulmonary disease, unspecified: Secondary | ICD-10-CM

## 2015-02-04 LAB — PULMONARY FUNCTION TEST
DL/VA % PRED: 39 %
DL/VA: 1.79 ml/min/mmHg/L
DLCO UNC: 5.92 ml/min/mmHg
DLCO unc % pred: 27 %
FEF 25-75 PRE: 0.35 L/s
FEF 25-75 Post: 0.42 L/sec
FEF2575-%Change-Post: 18 %
FEF2575-%PRED-POST: 23 %
FEF2575-%Pred-Pre: 19 %
FEV1-%CHANGE-POST: 2 %
FEV1-%Pred-Post: 48 %
FEV1-%Pred-Pre: 47 %
FEV1-Post: 1.01 L
FEV1-Pre: 0.98 L
FEV1FVC-%Change-Post: 2 %
FEV1FVC-%Pred-Pre: 63 %
FEV6-%CHANGE-POST: 1 %
FEV6-%PRED-POST: 74 %
FEV6-%Pred-Pre: 73 %
FEV6-POST: 1.95 L
FEV6-Pre: 1.93 L
FEV6FVC-%CHANGE-POST: 0 %
FEV6FVC-%PRED-POST: 99 %
FEV6FVC-%Pred-Pre: 99 %
FVC-%CHANGE-POST: 0 %
FVC-%PRED-POST: 73 %
FVC-%Pred-Pre: 73 %
FVC-Post: 2.03 L
FVC-Pre: 2.03 L
POST FEV1/FVC RATIO: 50 %
PRE FEV6/FVC RATIO: 95 %
Post FEV6/FVC ratio: 96 %
Pre FEV1/FVC ratio: 49 %

## 2015-02-04 MED ORDER — FLUCONAZOLE 100 MG PO TABS: 100.0000 mg | ORAL_TABLET | Freq: Every day | ORAL | Status: DC

## 2015-02-04 MED ORDER — AMOXICILLIN-POT CLAVULANATE 875-125 MG PO TABS: 1.0000 | ORAL_TABLET | Freq: Two times a day (BID) | ORAL | Status: DC

## 2015-02-04 MED ORDER — PREDNISONE 10 MG PO TABS: ORAL_TABLET | ORAL | Status: DC

## 2015-02-04 MED ORDER — FLUTTER DEVI
Status: AC
Start: 2015-02-04 — End: ?

## 2015-02-04 NOTE — Progress Notes (Signed)
Quick Note:  Spoke with pt and notified of results per Dr. Wert. Pt verbalized understanding and denied any questions.  ______ 

## 2015-02-04 NOTE — Telephone Encounter (Signed)
Dr Melvyn Novas, pt is requesting rx for diflucan b/c she states she usually develops vaginal yeast infection when she takes abx Please advise, thanks

## 2015-02-04 NOTE — Telephone Encounter (Signed)
Diflucan 100 mg take one  (refillx 2 )

## 2015-02-04 NOTE — Progress Notes (Signed)
PFT done today. 

## 2015-02-04 NOTE — Progress Notes (Signed)
Subjective:    Patient ID: Anne Morrow, female    DOB: 1946/03/05,    MRN: [id number]    Brief patient profile:  50 yowf active smoker  On and off 02 since 2002 but now on it 24/7 and on 02 can stil do a slow mart [employer] walk leaning on cart then  acutely worse with cough / sob/ elephant chest > admit with aecopd in [place] July 2016 with abn CT in [facility] hospital and persistent cough/   Walking more slowly than baseline and variable doe esp bad congestion in am with greenish tint referred to pulmonary clinic 12/09/2014 for eval of CT chest with MPN's   12/09/2014 1st [facility] office visit/ Anne Morrow   Chief Complaint  Patient presents with  . Advice Only    refer Anne Morrow for pulm nodule.   not back to baseline doe, broke rib from coughing. Very poor dentition  rec Augmentin 875 mg take one pill twice daily  X 10 days - .  Please remember to go to the  x-ray department downstairs for your tests - we will call you with the results when they are available. The key is to stop smoking completely before smoking completely stops you!  Take delsym two tsp every 12 hours and supplement if needed with  vicodin up to 2 every 4 hours Once you have eliminated the cough for 3 straight days try reducing the vicodin first,  then the delsym as tolerated.     12/23/2014  f/u ov/Anne Morrow re: ? Lung abscess related to poor dentition/ smoker Chief Complaint  Patient presents with  . Follow-up    Pt states her breathing has improved back to her normal baseline. She is coughing less and sputum is clear. No new co's today. She is using albuterol inhaler 1-2 x daily and neb with albuterol 2 x daily on average.   no money for dental care , still smoking Able to shop at [employer] on 02 pushing cart  Taking way too much saba "because I was told to" - very poor hfa rec Plan A = Automatic = Symbicort 80 Take 2 puffs first thing in am and then another 2 puffs about 12 hours later.  Work on inhaler technique:  relax and gently  blow all the way out then take a nice smooth deep breath back in, triggering the inhaler at same time you start breathing in.  Hold for up to 5 seconds if you can. Blow out thru nose. Rinse and gargle with water when done Plan B=  Backup  Only use your albuterol (proair) Plan C = Crisis - only use the nebulizer if you use plan B first and it doesn't work Plan D = Doctor, call if nebulizer use is going up  Plan E = ER, go there if all else fails The key is to stop smoking completely before smoking completely stops you!     02/04/2015  f/u ov/Anne Morrow re: GOLD III copd/ 02 dep/ still smoking  Chief Complaint  Patient presents with  . Follow-up    PFT done today. Pt c/o cough, rt ear pain, and sneezing for the past several wks. Cough is prod with clear to green sputum.     No longer shopping more weak than sob and very congested cough esp early in am with freq use of saba in multiple forms    No obvious day to day or daytime variability or assoc     cp or chest  tightness, subjective wheeze or overt sinus or hb symptoms. No unusual exp hx or h/o childhood pna/ asthma or knowledge of premature birth.  Sleeping ok without nocturnal  or early am exacerbation  of respiratory  c/o's or need for noct saba. Also denies any obvious fluctuation of symptoms with weather or environmental changes or other aggravating or alleviating factors except as outlined above   Current Medications, Allergies, Complete Past Medical History, Past Surgical History, Family History, and Social History were reviewed in [employer] record.  ROS  The following are not active complaints unless bolded sore throat, dysphagia, dental problems, itching, sneezing,  nasal congestion or excess/ purulent secretions, ear ache,   fever, chills, sweats, unintended wt loss, classically pleuritic or exertional cp, hemoptysis,  orthopnea pnd or leg swelling, presyncope, palpitations, abdominal pain, anorexia, nausea,  vomiting, diarrhea  or change in bowel or bladder habits, change in stools or urine, dysuria,hematuria,  rash, arthralgias, visual complaints, headache, numbness, weakness or ataxia or problems with walking or coordination,  change in mood/affect or memory.              Objective:   Physical Exam  Thin chronically ill frail wf nad on 2lpm pulsed    12/23/2014       94 > 02/04/2015    88  Wt Readings from Last 3 Encounters:  12/09/14 93 lb 6.4 oz (42.366 kg)  10/17/14 96 lb (43.545 kg)  09/20/14 96 lb 4.8 oz (43.681 kg)    Vital signs reviewed   HEENT: Poor dentition, turbinates, and orophanx. Nl external ear canals without cough reflex   NECK :  without JVD/Nodes/TM/ nl carotid upstrokes bilaterally   LUNGS: no acc muscle use,  Minimal insp and exp rhonchi bilaterally    CV:  RRR  no s3 or murmur or increase in P2, no edema   ABD:  soft and nontender with nl excursion in the supine position. No bruits or organomegaly, bowel sounds nl  MS:  warm without deformities, calf tenderness, cyanosis or clubbing  SKIN: warm and dry without lesions    NEURO:  alert, approp, no deficits     CXR PA and Lateral:   02/04/2015 :    I personally reviewed images and agree with radiology impression as follows:    Stable noncalcified nodule in the left upper mid lung field posteriorly. No new abnormality. Emphysema.        Assessment & Plan:

## 2015-02-04 NOTE — Patient Instructions (Addendum)
Augmentin 875 mg take one pill twice daily  X 10 days - take at breakfast and supper with large glass of water.  It would help reduce the usual side effects (diarrhea and yeast infections) if you ate cultured yogurt at lunch.   Prednisone 10 mg take  4 each am x 2 days,   2 each am x 2 days,  1 each am x 2 days and stop   Please remember to go to the  x-ray department downstairs for your tests - we will call you with the results when they are available.  Please schedule a follow up office visit in 4 weeks, sooner if needed    Add flutter valve

## 2015-02-04 NOTE — Telephone Encounter (Signed)
Spoke with the pt and notified rx was sent  She verbalized understanding, nothing further needed

## 2015-02-08 NOTE — Assessment & Plan Note (Addendum)
FNA 03/31/10 LLL nodule> necrosis and granulomatous changes, no ca Baseline cxr 04/09/14 no nodules CXR 10/17/14 3 nodules clearly viz CT chest 10/19/14 [facility] new 1.8 / 2cm LLL nodule, no change RUL and LUL post nodules  BS 10/21/14 [facility] : neg met dz cxr 12/09/2014  2 nodules viz/ the LUL much more organized a distinct since 10/17/14  - cxr 02/04/2015 slt decrease in LUL nodule   Again Discussed in detail all the  indications, usual  risks and alternatives  relative to the benefits with patient who agrees to proceed with conservative f/u as outlined  As she declines consideration for bx at this point having "been there before and regretted it" even p explained this doesn't mean we're dealing with the same process

## 2015-02-08 NOTE — Assessment & Plan Note (Addendum)
PFT's  02/04/2015  FEV1 1.01 (48 % ) ratio 50  p 2 % improvement from saba with DLCO  27 % corrects to 39 % for alv volume  Social he has severe disease and unfortunately is still smoking. Please see separate discussion of cigarette smoking. He is becoming more and more sedentary although not clear that this is due shortness of breath when she exerts but more fatigue. She is most bothered this point [facility] congested cough very well could represent underlying sinusitis but certainly represents a component of chronic bronchitis as well. I recommended a short course of Augmentin and prednisone as well as flutter valve and continued use of Symbicort.  Next step might be to change the Atrovent to a  LAMA of some type.  The proper method of use, as well as anticipated side effects, of a metered-dose inhaler are discussed and demonstrated to the patient. Improved effectiveness after extensive coaching during this visit to a level of approximately  75%   I had an extended discussion with the patient reviewing all relevant studies completed to date and  lasting 15 to 20 minutes of a 25 minute visit    Each maintenance medication was reviewed in detail including most importantly the difference between maintenance and prns and under what circumstances the prns are to be triggered using an action plan format that is not reflected in the computer generated alphabetically organized AVS.    Please see instructions for details which were reviewed in writing and the patient given a copy highlighting the part that I personally wrote and discussed at today's ov.   F/u q 6 weeks for now

## 2015-02-08 NOTE — Assessment & Plan Note (Signed)
Adequate control on present rx, reviewed > no change in rx needed  = 2lpm 24/7  

## 2015-02-08 NOTE — Assessment & Plan Note (Signed)
>   3 min discussion I reviewed the Fletcher curve with the patient that basically indicates  if you quit smoking when your best day FEV1 is still well preserved (as is only relatively true here   the case here)  it is highly unlikely you will progress to endstage disease and informed the patient there was no medication on the market that has proven to alter the curve/ its downward trajectory  or the likelihood of progression of their disease.  Therefore stopping smoking and maintaining abstinence is the most important aspect of her care, not choice of inhalers or for that matter, doctors.

## 2015-03-04 ENCOUNTER — Ambulatory Visit (INDEPENDENT_AMBULATORY_CARE_PROVIDER_SITE_OTHER): Payer: Medicare Other | Admitting: Internal Medicine

## 2015-03-04 ENCOUNTER — Encounter: Payer: Self-pay | Admitting: Internal Medicine

## 2015-03-04 VITALS — BP 108/58 | HR 89 | Ht 62.0 in | Wt 92.6 lb

## 2015-03-04 DIAGNOSIS — F17210 Nicotine dependence, cigarettes, uncomplicated: Secondary | ICD-10-CM | POA: Diagnosis not present

## 2015-03-04 DIAGNOSIS — J449 Chronic obstructive pulmonary disease, unspecified: Secondary | ICD-10-CM

## 2015-03-04 DIAGNOSIS — J9611 Chronic respiratory failure with hypoxia: Secondary | ICD-10-CM

## 2015-03-04 DIAGNOSIS — Z720 Tobacco use: Secondary | ICD-10-CM

## 2015-03-04 NOTE — Assessment & Plan Note (Signed)
Adequate control on present rx, reviewed > no change in rx needed   

## 2015-03-04 NOTE — Assessment & Plan Note (Addendum)
PFT's  02/04/2015  FEV1 1.01 (48 % ) ratio 50  p 2 % improvement from saba with DLCO  27 % corrects to 39 % for alv volume   - added flutter valve 02/04/2015    - 03/04/2015  extensive coaching HFA effectiveness =    75% (short Ti)  Def better with prednisone and not with spiriva c/w AB > fixed/ ecopd  rec increase symbicort  to 160 2bid       Use flutter more   I had an extended discussion with the patient and daughter  reviewing all relevant studies completed to date and  lasting 15 to 20 minutes of a 25 minute visit    Each maintenance medication was reviewed in detail including most importantly the difference between maintenance and prns and under what circumstances the prns are to be triggered using an action plan format that is not reflected in the computer generated alphabetically organized AVS.    Please see instructions for details which were reviewed in writing and the patient given a copy highlighting the part that I personally wrote and discussed at today's ov.

## 2015-03-04 NOTE — Progress Notes (Signed)
Subjective:    Patient ID: Anne Morrow, female    DOB: 1946-03-15,    MRN: [id number]    Brief patient profile:  53 yowf active smoker  On and off 02 since 2002 but now on it 24/7 and on 02 can stil do a slow [employer] walk leaning on cart then  acutely worse with cough / sob/ elephant chest > admit with aecopd in [place] July 2016 with abn CT in [facility] and persistent cough/   Walking more slowly than baseline and variable doe esp bad congestion in am with greenish tint referred to pulmonary clinic 12/09/2014 for eval of CT chest with MPN's   12/09/2014 1st [facility] office visit/ [name]   Chief Complaint  Patient presents with  . Advice Only    refer [name] for pulm nodule.   not back to baseline doe, broke rib from coughing. Very poor dentition  rec Augmentin 875 mg take one pill twice daily  X 10 days - .  Please remember to go to the  x-ray department downstairs for your tests - we will call you with the results when they are available. The key is to stop smoking completely before smoking completely stops you!  Take delsym two tsp every 12 hours and supplement if needed with  vicodin up to 2 every 4 hours Once you have eliminated the cough for 3 straight days try reducing the vicodin first,  then the delsym as tolerated.     12/23/2014  f/u ov/[name] re: ? Lung abscess related to poor dentition/ smoker Chief Complaint  Patient presents with  . Follow-up    Pt states her breathing has improved back to her normal baseline. She is coughing less and sputum is clear. No new co's today. She is using albuterol inhaler 1-2 x daily and neb with albuterol 2 x daily on average.   no money for dental care , still smoking Able to shop at [employer] pushing cart  Taking way too much saba "because I was told to" - very poor hfa rec Plan A = Automatic = Symbicort 80 Take 2 puffs first thing in am and then another 2 puffs about 12 hours later.  Work on inhaler technique:  relax and gently  blow all the way out then take a nice smooth deep breath back in, triggering the inhaler at same time you start breathing in.  Hold for up to 5 seconds if you can. Blow out thru nose. Rinse and gargle with water when done Plan B=  Backup  Only use your albuterol (proair) Plan C = Crisis - only use the nebulizer if you use plan B first and it doesn't work Plan D = Doctor, call if nebulizer use is going up  Plan E = ER, go there if all else fails The key is to stop smoking completely before smoking completely stops you!     02/04/2015  f/u ov/[name] re: GOLD III copd/ 02 dep/ still smoking  Chief Complaint  Patient presents with  . Follow-up    PFT done today. Pt c/o cough, rt ear pain, and sneezing for the past several wks. Cough is prod with clear to green sputum.     No longer shopping more weak than sob and very congested cough esp early in am with freq use of saba in multiple forms  rec Augmentin 875 mg take one pill twice daily  X 10 days - take at breakfast and supper  with large glass of water.  It would help reduce the usual side effects (diarrhea and yeast infections) if you ate cultured yogurt at lunch.  Prednisone 10 mg take  4 each am x 2 days,   2 each am x 2 days,  1 each am x 2 days and stop  Add flutter valve    03/04/2015  f/u ov/[name] re: GOLD III on symb 80 / no better on spiriva  Chief Complaint  Patient presents with  . Follow-up    Pt c/o continued cough with thick clear mucus, some wheeze and SOB. Pt states that symptoms are improved since LOV.   last pred x one week def better / back shopping again   No obvious day to day or daytime variability or assoc     cp or chest tightness, subjective wheeze or overt   hb symptoms. No unusual exp hx or h/o childhood pna/ asthma or knowledge of premature birth.  Sleeping ok without nocturnal  or early am exacerbation  of respiratory  c/o's or need for noct saba. Also denies any obvious fluctuation of symptoms with weather or  environmental changes or other aggravating or alleviating factors except as outlined above   Current Medications, Allergies, Complete Past Medical History, Past Surgical History, Family History, and Social History were reviewed in [employer] record.  ROS  The following are not active complaints unless bolded sore throat, dysphagia, dental problems, itching, sneezing,  nasal congestion or excess/ purulent secretions, ear ache,   fever, chills, sweats, unintended wt loss, classically pleuritic or exertional cp, hemoptysis,  orthopnea pnd or leg swelling, presyncope, palpitations, abdominal pain, anorexia, nausea, vomiting, diarrhea  or change in bowel or bladder habits, change in stools or urine, dysuria,hematuria,  rash, arthralgias, visual complaints, headache, numbness, weakness or ataxia or problems with walking or coordination,  change in mood/affect or memory.              Objective:   Physical Exam  Thin chronically ill frail wf nad on 2lpm pulsed / very congested rattling cough    12/23/2014       94 > 02/04/2015    88 > 03/04/2015  93     12/09/14 93 lb 6.4 oz (42.366 kg)  10/17/14 96 lb (43.545 kg)  09/20/14 96 lb 4.8 oz (43.681 kg)    Vital signs reviewed   HEENT: Poor dentition, turbinates, and orophanx. Nl external ear canals without cough reflex   NECK :  without JVD/Nodes/TM/ nl carotid upstrokes bilaterally   LUNGS: no acc muscle use,  Minimal insp and exp rhonchi bilaterally    CV:  RRR  no s3 or murmur or increase in P2, no edema   ABD:  soft and nontender with nl excursion in the supine position. No bruits or organomegaly, bowel sounds nl  MS:  warm without deformities, calf tenderness, cyanosis or clubbing  SKIN: warm and dry without lesions    NEURO:  alert, approp, no deficits             Assessment & Plan:

## 2015-03-04 NOTE — Patient Instructions (Signed)
symbicort 160 Take 2 puffs first thing in am and then another 2 puffs about 12 hours later.   Work on inhaler technique:  relax and gently blow all the way out then take a nice smooth deep breath back in, triggering the inhaler at same time you start breathing in.  Hold for up to 5 seconds if you can. Blow out thru nose. Rinse and gargle with water when done  Only use your albuterol (as a rescue medication to be used if you can't catch your breath by resting or doing a relaxed purse lip breathing pattern.  - The less you use it, the better it will work when you need it. - Ok to use up to 2 puffs  every 4 hours if you must but call for immediate appointment if use goes up over your usual need - Don't leave home without it !!  (think of it like the spare tire for your car)   Only use your nebulizer if you try the proair and if fails to relieve your breathing problems  If the nebulizer need goes up call me right away.   Use the flutter valve as much as you can  The key is to stop smoking completely before smoking completely stops you!    Please schedule a follow up office visit in 6 weeks, call sooner if needed

## 2015-03-04 NOTE — Assessment & Plan Note (Signed)
Discussed the risks and costs (both direct and indirect)  of smoking relative to the benefits of quitting but patient unwilling to commit at this point to a specific quit date.

## 2015-04-15 ENCOUNTER — Ambulatory Visit (INDEPENDENT_AMBULATORY_CARE_PROVIDER_SITE_OTHER): Payer: Medicare Other | Admitting: Internal Medicine

## 2015-04-15 ENCOUNTER — Encounter: Payer: Self-pay | Admitting: Internal Medicine

## 2015-04-15 ENCOUNTER — Ambulatory Visit (INDEPENDENT_AMBULATORY_CARE_PROVIDER_SITE_OTHER)
Admission: RE | Admit: 2015-04-15 | Discharge: 2015-04-15 | Disposition: A | Discharge status: Home / Self Care | Payer: Medicare Other | Source: Ambulatory Visit | Attending: Internal Medicine | Admitting: Internal Medicine

## 2015-04-15 VITALS — BP 108/66 | HR 96 | Ht 62.0 in | Wt 90.4 lb

## 2015-04-15 DIAGNOSIS — R918 Other nonspecific abnormal finding of lung field: Secondary | ICD-10-CM | POA: Diagnosis not present

## 2015-04-15 DIAGNOSIS — F17210 Nicotine dependence, cigarettes, uncomplicated: Secondary | ICD-10-CM | POA: Diagnosis not present

## 2015-04-15 DIAGNOSIS — J9611 Chronic respiratory failure with hypoxia: Secondary | ICD-10-CM | POA: Diagnosis not present

## 2015-04-15 DIAGNOSIS — J449 Chronic obstructive pulmonary disease, unspecified: Secondary | ICD-10-CM

## 2015-04-15 MED ORDER — BUDESONIDE-FORMOTEROL FUMARATE 160-4.5 MCG/ACT IN AERO: INHALATION_SPRAY | RESPIRATORY_TRACT | Status: DC

## 2015-04-15 NOTE — Patient Instructions (Addendum)
Continue symbicort 160 Take 2 puffs first thing in am and then another 2 puffs about 12 hours later.   Work on Engineer, technical sales technique:  relax and gently blow all the way out then take a nice smooth deep breath back in, triggering the inhaler at same time you start breathing in.  Hold for up to 5 seconds if you can. Blow out thru nose. Rinse and gargle with water when done  Only use your albuterol (ventolin) as a rescue medication to be used if you can't catch your breath by resting or doing a relaxed purse lip breathing pattern.  - The less you use it, the better it will work when you need it. - Ok to use up to 2 puffs  every 4 hours if you must but call for immediate appointment if use goes up over your usual need - Don't leave home without it !!  (think of it like the spare tire for your car)   Only use your nebulizer if you try the ventolin first and if fails to relieve your breathing problems  If the nebulizer need goes up call me right away.   For cough/ congestions > mucinex 1200 mg every 12hours as needed and u se the flutter valve as much as you can a  The key is to stop smoking completely before smoking completely stops you!   Please remember to go to the  x-ray department downstairs for your tests - we will call you with the results when they are available.     Please schedule a follow up visit in 3 months but call sooner if needed

## 2015-04-15 NOTE — Progress Notes (Signed)
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 Advice Only    refer [name] for pulm nodule.   not back to baseline doe, broke rib from coughing. Very poor dentition  rec Augmentin 875 mg take one pill twice daily  X 10 days - .  Please remember to go to the  x-ray department downstairs for your tests - we will call you with the results when they are available. The key is to stop smoking completely before smoking completely stops you!  Take delsym two tsp every 12 hours and supplement if needed with  vicodin up to 2 every 4 hours Once you have eliminated the cough for 3 straight days try reducing the vicodin first,  then the delsym as tolerated.     12/23/2014  f/u ov/Anne Morrow re: ? Lung abscess related to poor dentition/ smoker Chief Complaint  Patient presents with  . Follow-up    Pt states her breathing has improved back to her normal baseline. She is coughing less and sputum is clear. No new co's today. She is using albuterol inhaler 1-2 x daily and neb with albuterol 2 x daily on average.   no money for dental care , still smoking Able to shop at [employer] pushing cart  Taking way too much saba "because I was told to" - very poor hfa rec Plan A = Automatic = Symbicort 80 Take 2 puffs first thing in am and then another 2 puffs about 12 hours later.  Work  on inhaler technique:  relax and gently blow all the way out then take a nice smooth deep breath back in, triggering the inhaler at same time you start breathing in.  Hold for up to 5 seconds if you can. Blow out thru nose. Rinse and gargle with water when done Plan B=  Backup  Only use your albuterol (proair) Plan C = Crisis - only use the nebulizer if you use plan B first and it doesn't work Plan D = Doctor, call if nebulizer use is going up  Plan E = ER, go there if all else fails The key is to stop smoking completely before smoking completely stops you!     02/04/2015  f/u ov/Anne Morrow re: GOLD III copd/ 02 dep/ still smoking  Chief Complaint  Patient presents with  . Follow-up    PFT done today. Pt c/o cough, rt ear pain, and sneezing for the past several wks. Cough is prod with clear to green sputum.     No longer shopping more weak than sob and very congested cough esp early in am with freq use of saba in multiple forms  rec Augmentin 875 mg take one pill twice daily  X  10 days - take at breakfast and supper with large glass of water.  It would help reduce the usual side effects (diarrhea and yeast infections) if you ate cultured yogurt at lunch.  Prednisone 10 mg take  4 each am x 2 days,   2 each am x 2 days,  1 each am x 2 days and stop  Add flutter valve    03/04/2015  f/u ov/Anne Morrow re: GOLD III on symb 80 / no better on spiriva  Chief Complaint  Patient presents with  . Follow-up    Pt c/o continued cough with thick clear mucus, some wheeze and SOB. Pt states that symptoms are improved since LOV.   last pred x one week def better / back shopping again  rec symbicort 160 Take 2 puffs first thing in am and then another 2 puffs about 12 hours later.  Work on inhaler technique:    Only use your albuterol (as a rescue medication  Only use your nebulizer if you try the proair and if fails to relieve your breathing problems If the nebulizer need goes up call me right away.  Use the  flutter valve as much as you can The key is to stop smoking completely before smoking completely stops you!    04/15/2015  f/u ov/Anne Morrow re: GOLD III maint rx symbicort 80 bid and prn duoneb  Chief Complaint  Patient presents with  . Follow-up    Breathing has improved some, but her cough is unchanged. She is using ventolin once per wk on average and rarely uses neb.    doe = MMRC3 = can't walk 100 yards even at a slow pace at a flat grade s stopping due to sob  - can do one aisle at [facility]   No obvious day to day or daytime variability or assoc     purulent sputum or mucus plugs     cp or chest tightness, subjective wheeze or overt   hb symptoms. No unusual exp hx or h/o childhood pna/ asthma or knowledge of premature birth.  Sleeping ok without nocturnal  or early am exacerbation  of respiratory  c/o's or need for noct saba. Also denies any obvious fluctuation of symptoms with weather or environmental changes or other aggravating or alleviating factors except as outlined above   Current Medications, Allergies, Complete Past Medical History, Past Surgical History, Family History, and Social History were reviewed in [employer] record.  ROS  The following are not active complaints unless bolded sore throat, dysphagia, dental problems, itching, sneezing,  nasal congestion or excess/ purulent secretions, ear ache,   fever, chills, sweats, unintended wt loss, classically pleuritic or exertional cp, hemoptysis,  orthopnea pnd or leg swelling, presyncope, palpitations, abdominal pain, anorexia, nausea, vomiting, diarrhea  or change in bowel or bladder habits, change in stools or urine, dysuria,hematuria,  rash, arthralgias, visual complaints, headache, numbness, weakness or ataxia or problems with walking or coordination,  change in mood/affect or memory.              Objective:   Physical Exam  Thin chronically ill frail wf nad on 2lpm pulsed / very congested rattling cough     12/23/2014       94 > 02/04/2015    88 > 03/04/2015  93 > 04/15/2015  90     12/09/14 93 lb 6.4 oz (42.366 kg)  10/17/14 96 lb (43.545 kg)  09/20/14 96 lb 4.8 oz (43.681 kg)    Vital  signs reviewed   HEENT: Poor dentition, turbinates, and oropharynx. Nl external ear canals without cough reflex   NECK :  without JVD/Nodes/TM/ nl carotid upstrokes bilaterally   LUNGS: no acc muscle use,  Minimal insp and exp rhonchi bilaterally    CV:  RRR  no s3 or murmur or increase in P2, no edema   ABD:  soft and nontender with nl excursion in the supine position. No bruits or organomegaly, bowel sounds nl  MS:  warm without deformities, calf tenderness, cyanosis or clubbing  SKIN: warm and dry without lesions    NEURO:  alert, approp, no deficits    CXR PA and Lateral:   04/15/2015 :    I personally reviewed images and agree with radiology impression as follows:    Chronic emphysematous changes. Stable noncalcified nodule in the left upper lobe. Chronic posterior rib deformities bilaterally.                 Assessment & Plan:

## 2015-04-15 NOTE — Progress Notes (Signed)
Quick Note:  Spoke with pt and notified of results per Dr. Wert. Pt verbalized understanding and denied any questions.  ______ 

## 2015-04-16 NOTE — Assessment & Plan Note (Signed)

## 2015-04-16 NOTE — Assessment & Plan Note (Addendum)
PFT's  02/04/2015  FEV1 1.01 (48 % ) ratio 50  p 2 % improvement from saba with DLCO  27 % corrects to 39 % for alv volume   - added flutter valve 02/04/2015  - 04/15/2015  extensive coaching HFA effectiveness =    75% (short Ti)   Symptoms continue to be difficult to control probably largely due to smoking (see separate a/p)   I had an extended discussion with the patient reviewing all relevant studies completed to date and  lasting 15 to 20 minutes of a 25 minute visit    Each maintenance medication was reviewed in detail including most importantly the difference between maintenance and prns and under what circumstances the prns are to be triggered using an action plan format that is not reflected in the computer generated alphabetically organized AVS.    Please see instructions for details which were reviewed in writing and the patient given a copy highlighting the part that I personally wrote and discussed at today's ov.

## 2015-04-16 NOTE — Assessment & Plan Note (Signed)
Adequate control on present rx, reviewed > no change in rx needed  = 2lpm 24/7  

## 2015-04-16 NOTE — Assessment & Plan Note (Signed)
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

## 2015-07-14 ENCOUNTER — Encounter: Payer: Self-pay | Admitting: Internal Medicine

## 2015-07-14 ENCOUNTER — Ambulatory Visit (INDEPENDENT_AMBULATORY_CARE_PROVIDER_SITE_OTHER): Payer: Medicare Other | Admitting: Internal Medicine

## 2015-07-14 VITALS — BP 102/70 | HR 98 | Ht 62.0 in | Wt 89.0 lb

## 2015-07-14 DIAGNOSIS — J9611 Chronic respiratory failure with hypoxia: Secondary | ICD-10-CM

## 2015-07-14 DIAGNOSIS — F17210 Nicotine dependence, cigarettes, uncomplicated: Secondary | ICD-10-CM | POA: Diagnosis not present

## 2015-07-14 DIAGNOSIS — J441 Chronic obstructive pulmonary disease with (acute) exacerbation: Secondary | ICD-10-CM

## 2015-07-14 DIAGNOSIS — Z720 Tobacco use: Secondary | ICD-10-CM

## 2015-07-14 MED ORDER — GLYCOPYRROLATE-FORMOTEROL 9-4.8 MCG/ACT IN AERO: 2.0000 | INHALATION_SPRAY | Freq: Two times a day (BID) | RESPIRATORY_TRACT | Status: AC

## 2015-07-14 MED ORDER — AZITHROMYCIN 250 MG PO TABS: ORAL_TABLET | ORAL | Status: AC

## 2015-07-14 MED ORDER — PREDNISONE 10 MG PO TABS: ORAL_TABLET | ORAL | Status: AC

## 2015-07-14 NOTE — Assessment & Plan Note (Signed)
Adequate control on present rx, reviewed > no change in rx needed  > see cig smoking risk  (see separate a/p)

## 2015-07-14 NOTE — Progress Notes (Signed)
Subjective:    Patient ID: Anne Morrow, female    DOB: [birth year],    MRN: [id number]    Brief patient profile:  55 yowf active smoker  On and off 02 since 2002 but  As of 11/2014 on it 24/7 and on 02 can stil do a slow [employer] walk leaning on cart then  acutely worse with cough / sob/ elephant chest > admit with aecopd in [place] July 2016 with abn CT in [facility] hospital and persistent cough/   Walking more slowly than baseline and variable doe esp bad congestion in am with greenish tint referred to pulmonary clinic 12/09/2014 for eval of CT chest with MPN's   History of Present Illness  12/09/2014 1st [facility] office visit/ Kenitha Glendinning   Chief Complaint  Patient presents with  . Advice Only    refer [name] for pulm nodule.   not back to baseline doe, broke rib from coughing. Very poor dentition  rec Augmentin 875 mg take one pill twice daily  X 10 days - .  Please remember to go to the  x-ray department downstairs for your tests - we will call you with the results when they are available. The key is to stop smoking completely before smoking completely stops you!  Take delsym two tsp every 12 hours and supplement if needed with  vicodin up to 2 every 4 hours Once you have eliminated the cough for 3 straight days try reducing the vicodin first,  then the delsym as tolerated.     12/23/2014  f/u ov/Catelyn Friel re: ? Lung abscess related to poor dentition/ smoker Chief Complaint  Patient presents with  . Follow-up    Pt states her breathing has improved back to her normal baseline. She is coughing less and sputum is clear. No new co's today. She is using albuterol inhaler 1-2 x daily and neb with albuterol 2 x daily on average.   no money for dental care , still smoking Able to shop at [employer] on 02 pushing cart  Taking way too much saba "because I was told to" - very poor hfa rec Plan A = Automatic = Symbicort 80 Take 2 puffs first thing in am and then another 2 puffs about 12 hours later.  Work  on inhaler technique:  relax and gently blow all the way out then take a nice smooth deep breath back in, triggering the inhaler at same time you start breathing in.  Hold for up to 5 seconds if you can. Blow out thru nose. Rinse and gargle with water when done Plan B=  Backup  Only use your albuterol (proair) Plan C = Crisis - only use the nebulizer if you use plan B first and it doesn't work Plan D = Doctor, call if nebulizer use is going up  Plan E = ER, go there if all else fails The key is to stop smoking completely before smoking completely stops you!     02/04/2015  f/u ov/Kanasia Gayman re: GOLD III copd/ 02 dep/ still smoking  Chief Complaint  Patient presents with  . Follow-up    PFT done today. Pt c/o cough, rt ear pain, and sneezing for the past several wks. Cough is prod with clear to green sputum.     No longer shopping more weak than sob and very congested cough esp early in am with freq use of saba in multiple forms  rec Augmentin 875 mg take one pill twice daily  X  10 days - take at breakfast and supper with large glass of water.  It would help reduce the usual side effects (diarrhea and yeast infections) if you ate cultured yogurt at lunch.  Prednisone 10 mg take  4 each am x 2 days,   2 each am x 2 days,  1 each am x 2 days and stop  Add flutter valve    03/04/2015  f/u ov/Zanna Hawn re: GOLD III on symb 80 / no better on spiriva  Chief Complaint  Patient presents with  . Follow-up    Pt c/o continued cough with thick clear mucus, some wheeze and SOB. Pt states that symptoms are improved since LOV.   last pred x one week def better / back shopping again  rec symbicort 160 Take 2 puffs first thing in am and then another 2 puffs about 12 hours later.  Work on inhaler technique:    Only use your albuterol (as a rescue medication  Only use your nebulizer if you try the proair and if fails to relieve your breathing problems If the nebulizer need goes up call me right away.  Use the  flutter valve as much as you can The key is to stop smoking completely before smoking completely stops you!    04/15/2015  f/u ov/Jamisyn Langer re: GOLD III maint rx symbicort 80 bid and prn duoneb  Chief Complaint  Patient presents with  . Follow-up    Breathing has improved some, but her cough is unchanged. She is using ventolin once per wk on average and rarely uses neb.    doe = MMRC3 = can't walk 100 yards even at a slow pace at a flat grade s stopping due to sob  - can do one aisle at [employer]  rec Continue symbicort 160 Take 2 puffs first thing in am and then another 2 puffs about 12 hours later.  Work on Interior and spatial [employer]:  Only use your albuterol (ventolin) as a rescue medication Only use your nebulizer if you try the ventolin first and if fails to relieve your breathing problems If the nebulizer need goes up call me right away.  For cough/ congestions > mucinex 1200 mg every 12hours as needed and u se the flutter valve as much as you can a The key is to stop smoking completely before smoking completely stops you!      07/14/2015  f/u ov/Casen Pryor re: GOLD III Chief Complaint  Patient presents with  . Follow-up    Pt c/o increased SOB, cough with green sputum, aches, and wheezing x 1 month. She is not sleeping well and has no energy. She is using using albuterol inhaler at least once per day and neb with albuterol/atrovent 2 x per wk on average.   doe = even on 02 2lpm = MMRC3 = can't walk 100 yards even at a slow pace at a flat grade s stopping due to sob        No obvious day to day or daytime variability or assoc     cp or chest tightness, subjective wheeze or overt sinus or   hb symptoms. No unusual exp hx or h/o childhood pna/ asthma or knowledge of premature birth.  Sleeping ok without nocturnal  or early am exacerbation  of respiratory  c/o's or need for noct saba. Also denies any obvious fluctuation of symptoms with weather or environmental changes or other aggravating or  alleviating factors except as outlined above   Current Medications, Allergies, Complete  Past Medical History, Past Surgical History, Family History, and Social History were reviewed in [employer] record.  ROS  The following are not active complaints unless bolded sore throat, dysphagia, dental problems, itching, sneezing,  nasal congestion or excess/ purulent secretions, ear ache,   fever, chills, sweats, unintended wt loss, classically pleuritic or exertional cp, hemoptysis,  orthopnea pnd or leg swelling, presyncope, palpitations, abdominal pain, anorexia, nausea, vomiting, diarrhea  or change in bowel or bladder habits, change in stools or urine, dysuria,hematuria,  rash, arthralgias, visual complaints, headache, numbness, weakness or ataxia or problems with walking or coordination,  change in mood/affect or memory.              Objective:   Physical Exam  Thin chronically ill frail wf nad on 2lpm pulsed / still very congested rattling cough    12/23/2014       94 > 02/04/2015    88 > 03/04/2015  93 > 04/15/2015  90 > 07/14/2015 89     12/09/14 93 lb 6.4 oz (42.366 kg)  10/17/14 96 lb (43.545 kg)  09/20/14 96 lb 4.8 oz (43.681 kg)    Vital signs reviewed   HEENT: Poor dentition, turbinates, and oropharynx. Nl external ear canals without cough reflex   NECK :  without JVD/Nodes/TM/ nl carotid upstrokes bilaterally   LUNGS: no acc muscle use,  Minimal insp and exp rhonchi bilaterally    CV:  RRR  no s3 or murmur or increase in P2, no edema   ABD:  soft and nontender with nl excursion in the supine position. No bruits or organomegaly, bowel sounds nl  MS:  warm without deformities, calf tenderness, cyanosis or clubbing  SKIN: warm and dry without lesions    NEURO:  alert, approp, no deficits    CXR PA and Lateral:   04/15/2015 :    I personally reviewed images and agree with radiology impression as follows:    Chronic emphysematous changes. Stable  noncalcified nodule in the left upper lobe. Chronic posterior rib deformities bilaterally.                 Assessment & Plan:

## 2015-07-14 NOTE — Patient Instructions (Signed)
Plan A = Automatic =  Change from symbicort  To  BEVESPI Take 2 puffs first thing in am and then another 2 puffs about 12 hours later.    Plan B = Backup Only use your albuterol (ventolin or PROAIR) as a rescue medication to be used if you can't catch your breath by resting or doing a relaxed purse lip breathing pattern.  - The less you use it, the better it will work when you need it. - Ok to use the inhaler up to 2 puffs  every 4 hours if you must but call for appointment if use goes up over your usual need - Don't leave home without it !!  (think of it like the spare tire for your car)   Plan C = Crisis - only use your albuterol nebulizer if you first try Plan B and it fails to help > ok to use the nebulizer up to every 4 hours but if start needing it regularly call for immediate appointment  The key is to stop smoking completely before smoking completely stops you!   For cough / congestion > mucinex 1200 mg every 12 hours and as much flutter valve as you can   For flare of bronchitis: zpak Prednisone 10 mg take  4 each am x 2 days,   2 each am x 2 days,  1 each am x 2 days and stop   Please schedule a follow up office visit in 4 weeks, sooner if needed

## 2015-07-14 NOTE — Assessment & Plan Note (Signed)
>   3 min Discussed the risks and costs (both direct and indirect)  of smoking relative to the benefits of quitting but patient unwilling to commit at this point to a specific quit date.    Advised no smoking on 02/ benefit to stopping smoking may occur over 6 weeks with mc function improvement

## 2015-07-14 NOTE — Assessment & Plan Note (Signed)
PFT's  02/04/2015  FEV1 1.01 (48 % ) ratio 50  p 2 % improvement from saba with DLCO  27 % corrects to 39 % for alv volume   - added flutter valve 02/04/2015   - The proper method of use, as well as anticipated side effects, of a metered-dose inhaler are discussed and demonstrated to the patient. Improved effectiveness after extensive coaching during this visit to a level of approximately 75 % from a baseline of 50 %   I had an extended discussion with the patient reviewing all relevant studies completed to date and  lasting 15 to 20 minutes of a 25 minute visit   Has not tried bevespi > try one month supply  For flare > zpak/ Prednisone 10 mg take  4 each am x 2 days,   2 each am x 2 days,  1 each am x 2 days and stop    Each maintenance medication was reviewed in detail including most importantly the difference between maintenance and prns and under what circumstances the prns are to be triggered using an action plan format that is not reflected in the computer generated alphabetically organized AVS.    Please see instructions for details which were reviewed in writing and the patient given a copy highlighting the part that I personally wrote and discussed at today's ov.

## 2015-08-12 ENCOUNTER — Ambulatory Visit: Payer: Medicare Other | Admitting: Internal Medicine

## 2016-03-24 ENCOUNTER — Ambulatory Visit
Admission: RE | Admit: 2016-03-24 | Discharge: 2016-03-24 | Disposition: A | Discharge status: Home / Self Care | Payer: Medicare Other | Source: Ambulatory Visit | Attending: Family Medicine | Admitting: Family Medicine

## 2016-03-24 ENCOUNTER — Other Ambulatory Visit: Payer: Self-pay | Admitting: Family Medicine

## 2016-03-24 DIAGNOSIS — R059 Cough, unspecified: Secondary | ICD-10-CM

## 2016-03-24 DIAGNOSIS — J449 Chronic obstructive pulmonary disease, unspecified: Secondary | ICD-10-CM

## 2016-03-24 DIAGNOSIS — R05 Cough: Secondary | ICD-10-CM

## 2016-09-24 ENCOUNTER — Encounter (HOSPITAL_COMMUNITY): Payer: Medicare Other

## 2016-09-24 ENCOUNTER — Ambulatory Visit (HOSPITAL_COMMUNITY)
Admission: RE | Admit: 2016-09-24 | Discharge: 2016-09-24 | Disposition: A | Discharge status: Home / Self Care | Payer: Medicare Other | Source: Ambulatory Visit | Attending: Vascular Surgery | Admitting: Vascular Surgery

## 2016-09-24 ENCOUNTER — Ambulatory Visit: Payer: Medicare Other | Admitting: Vascular Surgery

## 2016-09-24 DIAGNOSIS — I6523 Occlusion and stenosis of bilateral carotid arteries: Secondary | ICD-10-CM | POA: Diagnosis not present

## 2016-09-24 LAB — VAS US CAROTID
LCCADDIAS: 13 cm/s
LCCADSYS: 42 cm/s
LEFT ECA DIAS: 10 cm/s
LICADDIAS: -24 cm/s
LICAPDIAS: 23 cm/s
LICAPSYS: 70 cm/s
Left CCA prox dias: 19 cm/s
Left CCA prox sys: 76 cm/s
Left ICA dist sys: -66 cm/s
RCCAPDIAS: 12 cm/s
RCCAPSYS: 67 cm/s
RIGHT CCA MID DIAS: 17 cm/s
RIGHT ECA DIAS: -12 cm/s
RIGHT VERTEBRAL DIAS: 13 cm/s
Right cca dist sys: -94 cm/s

## 2016-10-08 ENCOUNTER — Encounter (HOSPITAL_COMMUNITY): Payer: Medicare Other

## 2016-10-08 ENCOUNTER — Ambulatory Visit: Payer: Medicare Other | Admitting: Vascular Surgery

## 2016-12-12 IMAGING — CR DG CHEST 2V
2 series · 2 of 2 positions shown · non-contrast
Comparison: CT chest 10/19/2014, chest x-ray 10/19/2014

CLINICAL DATA: Shortness of breath, cough, congestion

EXAM:
CHEST  2 VIEW

[view not recorded (1 of 2)]
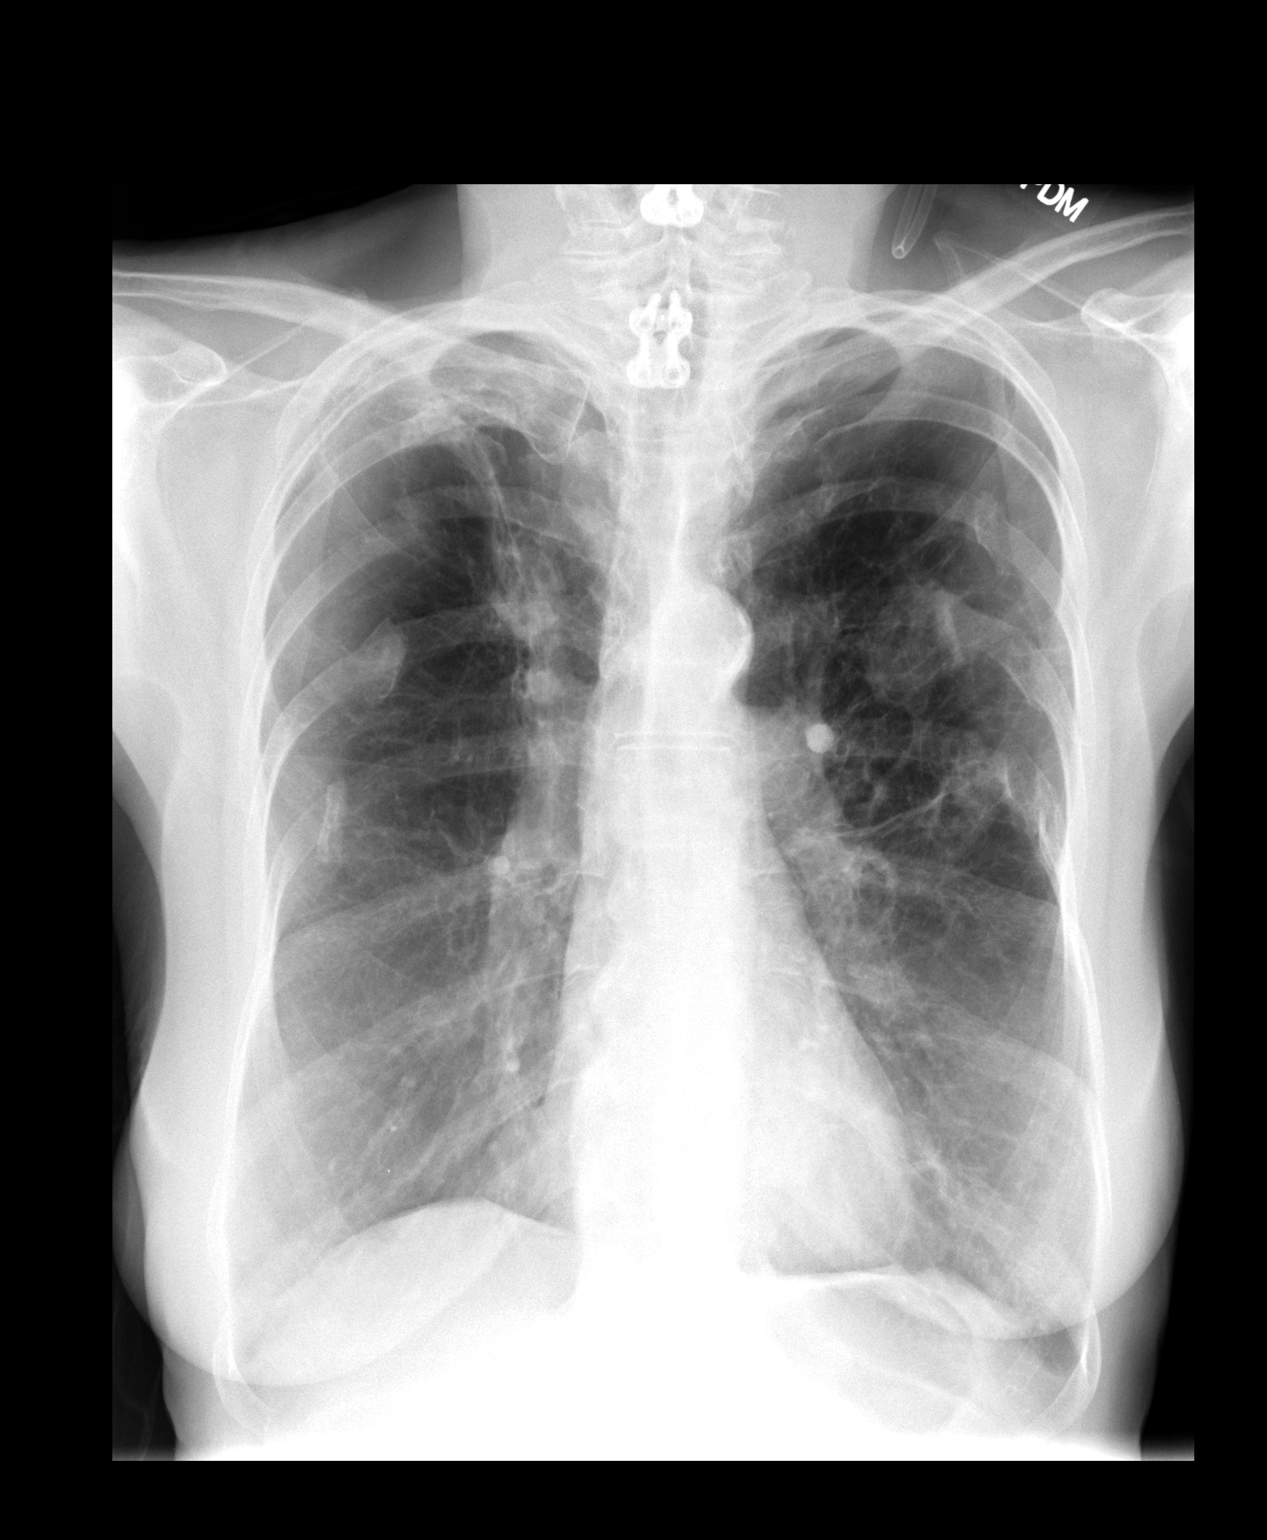

[view not recorded (2 of 2)]
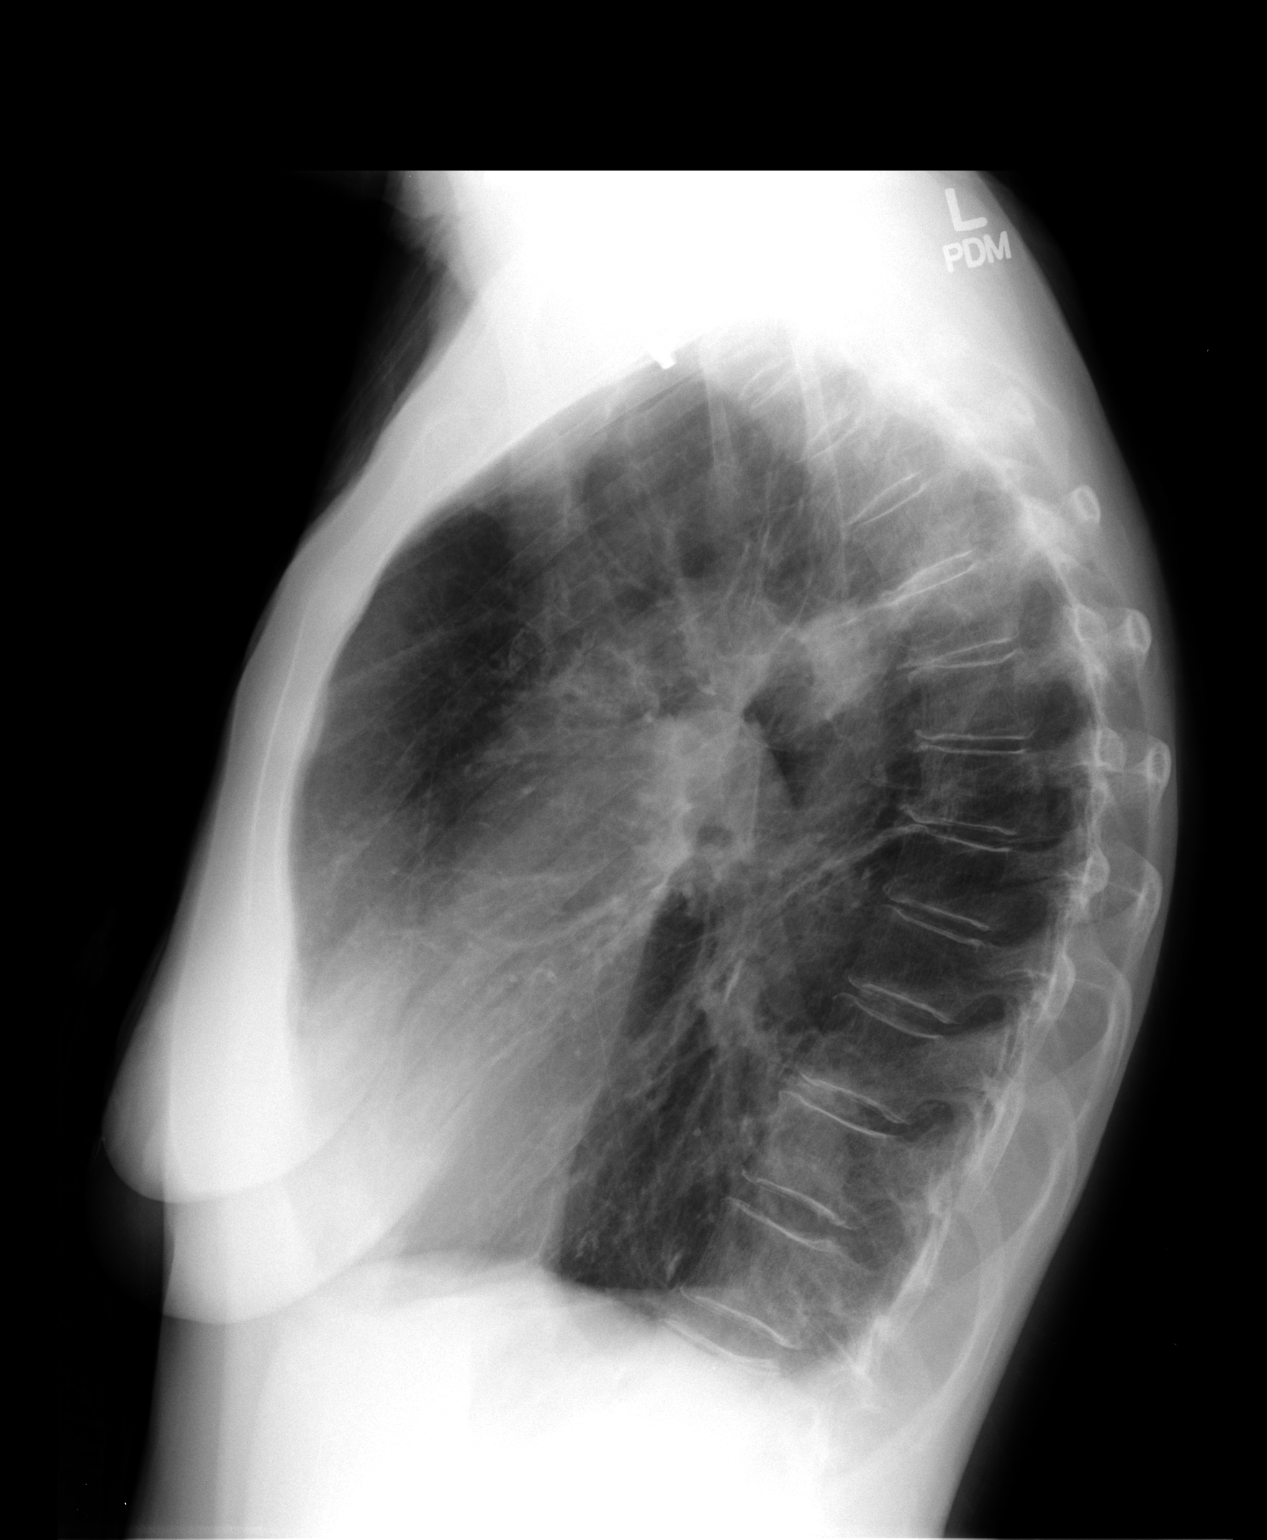

[2 of 2 positions shown; findings below may reference images not displayed]

FINDINGS: There is a 2.6 cm left upper lobe pulmonary nodule. The right upper
lobe pulmonary nodule seen on 10/19/2014 is not well appreciated on
the current exam. There is a 2.6 cm left lower lobe pulmonary
nodule. The lungs are hyperinflated likely secondary to COPD. There
is no pleural effusion or pneumothorax. The heart and mediastinal
contours are unremarkable.

There are multiple bilateral posterior rib fractures. There is
evidence of prior anterior cervical fusion.
IMPRESSION: No active cardiopulmonary disease.

Stable 2.6 cm left upper lobe pulmonary nodule. Right upper lobe
pulmonary nodule is not well delineated on the current exam. Stable
left lower lobe pulmonary nodule measuring 2.6 cm.

## 2017-11-20 DEATH — deceased
